# Patient Record
Sex: Female | Born: 1935 | Race: White | Hispanic: No | Marital: Married | Smoking: Never smoker
Health system: Southern US, Community
[De-identification: ages and names within clinical notes are randomized; demographics above are authoritative.]

## PROBLEM LIST (undated history)

## (undated) DIAGNOSIS — E039 Hypothyroidism, unspecified: Secondary | ICD-10-CM

## (undated) DIAGNOSIS — M797 Fibromyalgia: Secondary | ICD-10-CM

## (undated) DIAGNOSIS — Z923 Personal history of irradiation: Secondary | ICD-10-CM

## (undated) DIAGNOSIS — M1990 Unspecified osteoarthritis, unspecified site: Secondary | ICD-10-CM

## (undated) DIAGNOSIS — J302 Other seasonal allergic rhinitis: Secondary | ICD-10-CM

## (undated) HISTORY — PX: EYE SURGERY: SHX253

## (undated) HISTORY — PX: ABDOMINAL HYSTERECTOMY: SHX81

---

## 1998-07-03 ENCOUNTER — Ambulatory Visit (HOSPITAL_COMMUNITY): Admission: RE | Admit: 1998-07-03 | Discharge: 1998-07-03 | Payer: Self-pay | Admitting: Gynecology

## 1998-08-31 ENCOUNTER — Other Ambulatory Visit: Admission: RE | Admit: 1998-08-31 | Discharge: 1998-08-31 | Payer: Self-pay | Admitting: Gynecology

## 2011-10-27 DIAGNOSIS — M2010 Hallux valgus (acquired), unspecified foot: Secondary | ICD-10-CM | POA: Diagnosis not present

## 2011-10-27 DIAGNOSIS — M19079 Primary osteoarthritis, unspecified ankle and foot: Secondary | ICD-10-CM | POA: Diagnosis not present

## 2011-10-27 DIAGNOSIS — G5760 Lesion of plantar nerve, unspecified lower limb: Secondary | ICD-10-CM | POA: Diagnosis not present

## 2011-11-02 DIAGNOSIS — L905 Scar conditions and fibrosis of skin: Secondary | ICD-10-CM | POA: Diagnosis not present

## 2011-12-26 DIAGNOSIS — H26499 Other secondary cataract, unspecified eye: Secondary | ICD-10-CM | POA: Diagnosis not present

## 2012-02-01 DIAGNOSIS — Z85828 Personal history of other malignant neoplasm of skin: Secondary | ICD-10-CM | POA: Diagnosis not present

## 2012-03-09 ENCOUNTER — Other Ambulatory Visit (HOSPITAL_COMMUNITY): Payer: Self-pay | Admitting: Internal Medicine

## 2012-03-09 DIAGNOSIS — R29818 Other symptoms and signs involving the nervous system: Secondary | ICD-10-CM

## 2012-03-09 DIAGNOSIS — R42 Dizziness and giddiness: Secondary | ICD-10-CM | POA: Diagnosis not present

## 2012-03-12 ENCOUNTER — Ambulatory Visit (HOSPITAL_COMMUNITY): Admission: RE | Admit: 2012-03-12 | Payer: Self-pay | Source: Ambulatory Visit

## 2012-03-12 ENCOUNTER — Ambulatory Visit (HOSPITAL_COMMUNITY)
Admission: RE | Admit: 2012-03-12 | Discharge: 2012-03-12 | Disposition: A | Discharge status: Home / Self Care | Payer: Medicare Other | Source: Ambulatory Visit | Attending: Internal Medicine | Admitting: Internal Medicine

## 2012-03-12 DIAGNOSIS — G319 Degenerative disease of nervous system, unspecified: Secondary | ICD-10-CM | POA: Insufficient documentation

## 2012-03-12 DIAGNOSIS — R29818 Other symptoms and signs involving the nervous system: Secondary | ICD-10-CM

## 2012-03-12 DIAGNOSIS — R42 Dizziness and giddiness: Secondary | ICD-10-CM | POA: Insufficient documentation

## 2012-03-12 DIAGNOSIS — Z8589 Personal history of malignant neoplasm of other organs and systems: Secondary | ICD-10-CM | POA: Insufficient documentation

## 2012-03-12 DIAGNOSIS — Z859 Personal history of malignant neoplasm, unspecified: Secondary | ICD-10-CM | POA: Diagnosis not present

## 2012-03-12 DIAGNOSIS — I6509 Occlusion and stenosis of unspecified vertebral artery: Secondary | ICD-10-CM | POA: Insufficient documentation

## 2012-03-12 DIAGNOSIS — R262 Difficulty in walking, not elsewhere classified: Secondary | ICD-10-CM | POA: Insufficient documentation

## 2012-03-12 DIAGNOSIS — I651 Occlusion and stenosis of basilar artery: Secondary | ICD-10-CM | POA: Insufficient documentation

## 2012-04-11 DIAGNOSIS — Z131 Encounter for screening for diabetes mellitus: Secondary | ICD-10-CM | POA: Diagnosis not present

## 2012-04-11 DIAGNOSIS — E039 Hypothyroidism, unspecified: Secondary | ICD-10-CM | POA: Diagnosis not present

## 2012-04-11 DIAGNOSIS — G500 Trigeminal neuralgia: Secondary | ICD-10-CM | POA: Diagnosis not present

## 2012-04-11 DIAGNOSIS — K5900 Constipation, unspecified: Secondary | ICD-10-CM | POA: Diagnosis not present

## 2012-04-11 DIAGNOSIS — Z0000 Encounter for general adult medical examination without abnormal findings: Secondary | ICD-10-CM | POA: Diagnosis not present

## 2012-08-01 DIAGNOSIS — Z85828 Personal history of other malignant neoplasm of skin: Secondary | ICD-10-CM | POA: Diagnosis not present

## 2012-08-01 DIAGNOSIS — D485 Neoplasm of uncertain behavior of skin: Secondary | ICD-10-CM | POA: Diagnosis not present

## 2012-08-21 DIAGNOSIS — Z23 Encounter for immunization: Secondary | ICD-10-CM | POA: Diagnosis not present

## 2012-10-03 DIAGNOSIS — E039 Hypothyroidism, unspecified: Secondary | ICD-10-CM | POA: Diagnosis not present

## 2012-10-05 DIAGNOSIS — H26499 Other secondary cataract, unspecified eye: Secondary | ICD-10-CM | POA: Diagnosis not present

## 2012-11-26 DIAGNOSIS — H26499 Other secondary cataract, unspecified eye: Secondary | ICD-10-CM | POA: Diagnosis not present

## 2012-12-03 DIAGNOSIS — H26499 Other secondary cataract, unspecified eye: Secondary | ICD-10-CM | POA: Diagnosis not present

## 2012-12-04 DIAGNOSIS — N3090 Cystitis, unspecified without hematuria: Secondary | ICD-10-CM | POA: Diagnosis not present

## 2012-12-04 DIAGNOSIS — L723 Sebaceous cyst: Secondary | ICD-10-CM | POA: Diagnosis not present

## 2012-12-04 DIAGNOSIS — R319 Hematuria, unspecified: Secondary | ICD-10-CM | POA: Diagnosis not present

## 2013-02-13 DIAGNOSIS — M5430 Sciatica, unspecified side: Secondary | ICD-10-CM | POA: Diagnosis not present

## 2013-02-14 DIAGNOSIS — L219 Seborrheic dermatitis, unspecified: Secondary | ICD-10-CM | POA: Diagnosis not present

## 2013-02-14 DIAGNOSIS — Z85828 Personal history of other malignant neoplasm of skin: Secondary | ICD-10-CM | POA: Diagnosis not present

## 2013-02-25 DIAGNOSIS — M25559 Pain in unspecified hip: Secondary | ICD-10-CM | POA: Diagnosis not present

## 2013-02-25 DIAGNOSIS — M4120 Other idiopathic scoliosis, site unspecified: Secondary | ICD-10-CM | POA: Diagnosis not present

## 2013-02-25 DIAGNOSIS — M47817 Spondylosis without myelopathy or radiculopathy, lumbosacral region: Secondary | ICD-10-CM | POA: Diagnosis not present

## 2013-02-25 DIAGNOSIS — M25569 Pain in unspecified knee: Secondary | ICD-10-CM | POA: Diagnosis not present

## 2013-02-27 DIAGNOSIS — M25559 Pain in unspecified hip: Secondary | ICD-10-CM | POA: Diagnosis not present

## 2013-03-28 DIAGNOSIS — M25559 Pain in unspecified hip: Secondary | ICD-10-CM | POA: Diagnosis not present

## 2013-04-04 DIAGNOSIS — R262 Difficulty in walking, not elsewhere classified: Secondary | ICD-10-CM | POA: Diagnosis not present

## 2013-04-04 DIAGNOSIS — M25559 Pain in unspecified hip: Secondary | ICD-10-CM | POA: Diagnosis not present

## 2013-04-04 DIAGNOSIS — M6281 Muscle weakness (generalized): Secondary | ICD-10-CM | POA: Diagnosis not present

## 2013-04-08 DIAGNOSIS — R262 Difficulty in walking, not elsewhere classified: Secondary | ICD-10-CM | POA: Diagnosis not present

## 2013-04-08 DIAGNOSIS — M6281 Muscle weakness (generalized): Secondary | ICD-10-CM | POA: Diagnosis not present

## 2013-04-08 DIAGNOSIS — M25559 Pain in unspecified hip: Secondary | ICD-10-CM | POA: Diagnosis not present

## 2013-04-10 DIAGNOSIS — M6281 Muscle weakness (generalized): Secondary | ICD-10-CM | POA: Diagnosis not present

## 2013-04-10 DIAGNOSIS — R262 Difficulty in walking, not elsewhere classified: Secondary | ICD-10-CM | POA: Diagnosis not present

## 2013-04-10 DIAGNOSIS — M25559 Pain in unspecified hip: Secondary | ICD-10-CM | POA: Diagnosis not present

## 2013-04-12 DIAGNOSIS — M6281 Muscle weakness (generalized): Secondary | ICD-10-CM | POA: Diagnosis not present

## 2013-04-12 DIAGNOSIS — R262 Difficulty in walking, not elsewhere classified: Secondary | ICD-10-CM | POA: Diagnosis not present

## 2013-04-15 DIAGNOSIS — M47817 Spondylosis without myelopathy or radiculopathy, lumbosacral region: Secondary | ICD-10-CM | POA: Diagnosis not present

## 2013-04-15 DIAGNOSIS — M25569 Pain in unspecified knee: Secondary | ICD-10-CM | POA: Diagnosis not present

## 2013-04-15 DIAGNOSIS — M25559 Pain in unspecified hip: Secondary | ICD-10-CM | POA: Diagnosis not present

## 2013-04-17 DIAGNOSIS — M25559 Pain in unspecified hip: Secondary | ICD-10-CM | POA: Diagnosis not present

## 2013-04-17 DIAGNOSIS — R262 Difficulty in walking, not elsewhere classified: Secondary | ICD-10-CM | POA: Diagnosis not present

## 2013-04-17 DIAGNOSIS — M6281 Muscle weakness (generalized): Secondary | ICD-10-CM | POA: Diagnosis not present

## 2013-04-19 DIAGNOSIS — R262 Difficulty in walking, not elsewhere classified: Secondary | ICD-10-CM | POA: Diagnosis not present

## 2013-04-19 DIAGNOSIS — M6281 Muscle weakness (generalized): Secondary | ICD-10-CM | POA: Diagnosis not present

## 2013-04-24 DIAGNOSIS — G500 Trigeminal neuralgia: Secondary | ICD-10-CM | POA: Diagnosis not present

## 2013-04-24 DIAGNOSIS — M5430 Sciatica, unspecified side: Secondary | ICD-10-CM | POA: Diagnosis not present

## 2013-04-24 DIAGNOSIS — E039 Hypothyroidism, unspecified: Secondary | ICD-10-CM | POA: Diagnosis not present

## 2013-04-24 DIAGNOSIS — Z79899 Other long term (current) drug therapy: Secondary | ICD-10-CM | POA: Diagnosis not present

## 2013-06-05 DIAGNOSIS — H11449 Conjunctival cysts, unspecified eye: Secondary | ICD-10-CM | POA: Diagnosis not present

## 2013-07-18 DIAGNOSIS — G500 Trigeminal neuralgia: Secondary | ICD-10-CM | POA: Diagnosis not present

## 2013-07-18 DIAGNOSIS — Z0000 Encounter for general adult medical examination without abnormal findings: Secondary | ICD-10-CM | POA: Diagnosis not present

## 2013-07-18 DIAGNOSIS — E039 Hypothyroidism, unspecified: Secondary | ICD-10-CM | POA: Diagnosis not present

## 2013-07-18 DIAGNOSIS — K5900 Constipation, unspecified: Secondary | ICD-10-CM | POA: Diagnosis not present

## 2013-07-18 DIAGNOSIS — Z1331 Encounter for screening for depression: Secondary | ICD-10-CM | POA: Diagnosis not present

## 2013-08-09 DIAGNOSIS — Z23 Encounter for immunization: Secondary | ICD-10-CM | POA: Diagnosis not present

## 2014-01-15 DIAGNOSIS — M76899 Other specified enthesopathies of unspecified lower limb, excluding foot: Secondary | ICD-10-CM | POA: Diagnosis not present

## 2014-01-15 DIAGNOSIS — E7800 Pure hypercholesterolemia, unspecified: Secondary | ICD-10-CM | POA: Diagnosis not present

## 2014-01-15 DIAGNOSIS — G500 Trigeminal neuralgia: Secondary | ICD-10-CM | POA: Diagnosis not present

## 2014-01-15 DIAGNOSIS — E039 Hypothyroidism, unspecified: Secondary | ICD-10-CM | POA: Diagnosis not present

## 2014-03-10 DIAGNOSIS — M25569 Pain in unspecified knee: Secondary | ICD-10-CM | POA: Diagnosis not present

## 2014-03-13 DIAGNOSIS — M545 Low back pain, unspecified: Secondary | ICD-10-CM | POA: Diagnosis not present

## 2014-03-13 DIAGNOSIS — M25569 Pain in unspecified knee: Secondary | ICD-10-CM | POA: Diagnosis not present

## 2014-03-13 DIAGNOSIS — M25559 Pain in unspecified hip: Secondary | ICD-10-CM | POA: Diagnosis not present

## 2014-04-08 DIAGNOSIS — G500 Trigeminal neuralgia: Secondary | ICD-10-CM | POA: Diagnosis not present

## 2014-04-08 DIAGNOSIS — Z79899 Other long term (current) drug therapy: Secondary | ICD-10-CM | POA: Diagnosis not present

## 2014-04-11 DIAGNOSIS — D235 Other benign neoplasm of skin of trunk: Secondary | ICD-10-CM | POA: Diagnosis not present

## 2014-04-11 DIAGNOSIS — L821 Other seborrheic keratosis: Secondary | ICD-10-CM | POA: Diagnosis not present

## 2014-04-11 DIAGNOSIS — L570 Actinic keratosis: Secondary | ICD-10-CM | POA: Diagnosis not present

## 2014-04-11 DIAGNOSIS — D1801 Hemangioma of skin and subcutaneous tissue: Secondary | ICD-10-CM | POA: Diagnosis not present

## 2014-04-11 DIAGNOSIS — L819 Disorder of pigmentation, unspecified: Secondary | ICD-10-CM | POA: Diagnosis not present

## 2014-04-11 DIAGNOSIS — D485 Neoplasm of uncertain behavior of skin: Secondary | ICD-10-CM | POA: Diagnosis not present

## 2014-05-12 DIAGNOSIS — M159 Polyosteoarthritis, unspecified: Secondary | ICD-10-CM | POA: Diagnosis not present

## 2014-07-21 DIAGNOSIS — Z0000 Encounter for general adult medical examination without abnormal findings: Secondary | ICD-10-CM | POA: Diagnosis not present

## 2014-07-21 DIAGNOSIS — G500 Trigeminal neuralgia: Secondary | ICD-10-CM | POA: Diagnosis not present

## 2014-07-21 DIAGNOSIS — Z23 Encounter for immunization: Secondary | ICD-10-CM | POA: Diagnosis not present

## 2014-07-21 DIAGNOSIS — G479 Sleep disorder, unspecified: Secondary | ICD-10-CM | POA: Diagnosis not present

## 2014-07-21 DIAGNOSIS — Z79899 Other long term (current) drug therapy: Secondary | ICD-10-CM | POA: Diagnosis not present

## 2014-07-21 DIAGNOSIS — E039 Hypothyroidism, unspecified: Secondary | ICD-10-CM | POA: Diagnosis not present

## 2014-07-21 DIAGNOSIS — Z1331 Encounter for screening for depression: Secondary | ICD-10-CM | POA: Diagnosis not present

## 2014-07-29 DIAGNOSIS — M1712 Unilateral primary osteoarthritis, left knee: Secondary | ICD-10-CM | POA: Diagnosis not present

## 2014-12-25 DIAGNOSIS — L570 Actinic keratosis: Secondary | ICD-10-CM | POA: Diagnosis not present

## 2015-01-15 DIAGNOSIS — E039 Hypothyroidism, unspecified: Secondary | ICD-10-CM | POA: Diagnosis not present

## 2015-01-15 DIAGNOSIS — G479 Sleep disorder, unspecified: Secondary | ICD-10-CM | POA: Diagnosis not present

## 2015-02-24 DIAGNOSIS — L905 Scar conditions and fibrosis of skin: Secondary | ICD-10-CM | POA: Diagnosis not present

## 2015-02-24 DIAGNOSIS — L820 Inflamed seborrheic keratosis: Secondary | ICD-10-CM | POA: Diagnosis not present

## 2015-07-23 DIAGNOSIS — Z1389 Encounter for screening for other disorder: Secondary | ICD-10-CM | POA: Diagnosis not present

## 2015-07-23 DIAGNOSIS — E039 Hypothyroidism, unspecified: Secondary | ICD-10-CM | POA: Diagnosis not present

## 2015-07-23 DIAGNOSIS — Z23 Encounter for immunization: Secondary | ICD-10-CM | POA: Diagnosis not present

## 2015-07-23 DIAGNOSIS — Z79899 Other long term (current) drug therapy: Secondary | ICD-10-CM | POA: Diagnosis not present

## 2015-07-23 DIAGNOSIS — I10 Essential (primary) hypertension: Secondary | ICD-10-CM | POA: Diagnosis not present

## 2015-07-23 DIAGNOSIS — Z0000 Encounter for general adult medical examination without abnormal findings: Secondary | ICD-10-CM | POA: Diagnosis not present

## 2015-07-23 DIAGNOSIS — E780 Pure hypercholesterolemia: Secondary | ICD-10-CM | POA: Diagnosis not present

## 2015-08-19 DIAGNOSIS — I10 Essential (primary) hypertension: Secondary | ICD-10-CM | POA: Diagnosis not present

## 2015-09-22 DIAGNOSIS — L570 Actinic keratosis: Secondary | ICD-10-CM | POA: Diagnosis not present

## 2015-09-22 DIAGNOSIS — L821 Other seborrheic keratosis: Secondary | ICD-10-CM | POA: Diagnosis not present

## 2015-09-22 DIAGNOSIS — L812 Freckles: Secondary | ICD-10-CM | POA: Diagnosis not present

## 2015-09-22 DIAGNOSIS — D225 Melanocytic nevi of trunk: Secondary | ICD-10-CM | POA: Diagnosis not present

## 2015-11-12 DIAGNOSIS — M1712 Unilateral primary osteoarthritis, left knee: Secondary | ICD-10-CM | POA: Diagnosis not present

## 2015-11-12 DIAGNOSIS — M545 Low back pain: Secondary | ICD-10-CM | POA: Diagnosis not present

## 2016-04-04 DIAGNOSIS — H11441 Conjunctival cysts, right eye: Secondary | ICD-10-CM | POA: Diagnosis not present

## 2016-04-07 DIAGNOSIS — D1801 Hemangioma of skin and subcutaneous tissue: Secondary | ICD-10-CM | POA: Diagnosis not present

## 2016-04-07 DIAGNOSIS — L814 Other melanin hyperpigmentation: Secondary | ICD-10-CM | POA: Diagnosis not present

## 2016-04-07 DIAGNOSIS — L570 Actinic keratosis: Secondary | ICD-10-CM | POA: Diagnosis not present

## 2016-04-07 DIAGNOSIS — L853 Xerosis cutis: Secondary | ICD-10-CM | POA: Diagnosis not present

## 2016-05-02 DIAGNOSIS — K5901 Slow transit constipation: Secondary | ICD-10-CM | POA: Diagnosis not present

## 2016-05-02 DIAGNOSIS — L608 Other nail disorders: Secondary | ICD-10-CM | POA: Diagnosis not present

## 2016-05-02 DIAGNOSIS — R202 Paresthesia of skin: Secondary | ICD-10-CM | POA: Diagnosis not present

## 2016-05-09 DIAGNOSIS — M1812 Unilateral primary osteoarthritis of first carpometacarpal joint, left hand: Secondary | ICD-10-CM | POA: Insufficient documentation

## 2016-05-09 DIAGNOSIS — M79642 Pain in left hand: Secondary | ICD-10-CM | POA: Diagnosis not present

## 2016-05-09 DIAGNOSIS — M65352 Trigger finger, left little finger: Secondary | ICD-10-CM | POA: Diagnosis not present

## 2016-05-09 DIAGNOSIS — M6740 Ganglion, unspecified site: Secondary | ICD-10-CM | POA: Insufficient documentation

## 2016-05-09 DIAGNOSIS — M19042 Primary osteoarthritis, left hand: Secondary | ICD-10-CM | POA: Insufficient documentation

## 2016-05-09 DIAGNOSIS — R52 Pain, unspecified: Secondary | ICD-10-CM | POA: Insufficient documentation

## 2016-05-09 DIAGNOSIS — M67442 Ganglion, left hand: Secondary | ICD-10-CM | POA: Diagnosis not present

## 2016-05-12 ENCOUNTER — Other Ambulatory Visit: Payer: Self-pay | Admitting: Orthopedic Surgery

## 2016-05-27 ENCOUNTER — Encounter (HOSPITAL_BASED_OUTPATIENT_CLINIC_OR_DEPARTMENT_OTHER): Payer: Self-pay | Admitting: *Deleted

## 2016-06-02 ENCOUNTER — Ambulatory Visit (HOSPITAL_BASED_OUTPATIENT_CLINIC_OR_DEPARTMENT_OTHER)
Admission: RE | Admit: 2016-06-02 | Discharge: 2016-06-02 | Disposition: A | Discharge status: Home / Self Care | Payer: Medicare Other | Source: Ambulatory Visit | Attending: Orthopedic Surgery | Admitting: Orthopedic Surgery

## 2016-06-02 ENCOUNTER — Encounter (HOSPITAL_BASED_OUTPATIENT_CLINIC_OR_DEPARTMENT_OTHER): Payer: Self-pay | Admitting: Anesthesiology

## 2016-06-02 ENCOUNTER — Encounter (HOSPITAL_BASED_OUTPATIENT_CLINIC_OR_DEPARTMENT_OTHER): Payer: Self-pay

## 2016-06-02 ENCOUNTER — Encounter (HOSPITAL_BASED_OUTPATIENT_CLINIC_OR_DEPARTMENT_OTHER)
Admission: RE | Disposition: A | Discharge status: Home / Self Care | Payer: Self-pay | Source: Ambulatory Visit | Attending: Orthopedic Surgery

## 2016-06-02 ENCOUNTER — Ambulatory Visit (HOSPITAL_BASED_OUTPATIENT_CLINIC_OR_DEPARTMENT_OTHER): Payer: Medicare Other | Admitting: Anesthesiology

## 2016-06-02 ENCOUNTER — Ambulatory Visit (HOSPITAL_BASED_OUTPATIENT_CLINIC_OR_DEPARTMENT_OTHER): Admit: 2016-06-02 | Payer: Self-pay | Admitting: Orthopedic Surgery

## 2016-06-02 DIAGNOSIS — M151 Heberden's nodes (with arthropathy): Secondary | ICD-10-CM | POA: Diagnosis not present

## 2016-06-02 DIAGNOSIS — M67442 Ganglion, left hand: Secondary | ICD-10-CM | POA: Insufficient documentation

## 2016-06-02 DIAGNOSIS — M19072 Primary osteoarthritis, left ankle and foot: Secondary | ICD-10-CM | POA: Diagnosis not present

## 2016-06-02 DIAGNOSIS — E039 Hypothyroidism, unspecified: Secondary | ICD-10-CM | POA: Insufficient documentation

## 2016-06-02 DIAGNOSIS — M1810 Unilateral primary osteoarthritis of first carpometacarpal joint, unspecified hand: Secondary | ICD-10-CM | POA: Insufficient documentation

## 2016-06-02 DIAGNOSIS — M797 Fibromyalgia: Secondary | ICD-10-CM | POA: Insufficient documentation

## 2016-06-02 HISTORY — DX: Hypothyroidism, unspecified: E03.9

## 2016-06-02 HISTORY — DX: Other seasonal allergic rhinitis: J30.2

## 2016-06-02 HISTORY — PX: MASS EXCISION: SHX2000

## 2016-06-02 HISTORY — DX: Fibromyalgia: M79.7

## 2016-06-02 SURGERY — EXCISION MASS
Anesthesia: Monitor Anesthesia Care | Laterality: Left

## 2016-06-02 SURGERY — EXCISION MASS
Anesthesia: Monitor Anesthesia Care | Site: Finger | Laterality: Left

## 2016-06-02 MED ORDER — MIDAZOLAM HCL 2 MG/2ML IJ SOLN: 1.0000 mg | INTRAMUSCULAR | Status: DC | PRN

## 2016-06-02 MED ORDER — LACTATED RINGERS IV SOLN
INTRAVENOUS | Status: DC
  Administered 2016-06-02: 11:00:00 via INTRAVENOUS

## 2016-06-02 MED ORDER — LIDOCAINE HCL (PF) 0.5 % IJ SOLN
INTRAMUSCULAR | Status: DC | PRN
  Administered 2016-06-02: 30 mL via INTRAVENOUS

## 2016-06-02 MED ORDER — FENTANYL CITRATE (PF) 100 MCG/2ML IJ SOLN
INTRAMUSCULAR | Status: AC
  Filled 2016-06-02: qty 2

## 2016-06-02 MED ORDER — FENTANYL CITRATE (PF) 100 MCG/2ML IJ SOLN
INTRAMUSCULAR | Status: DC | PRN
  Administered 2016-06-02: 100 ug via INTRAVENOUS

## 2016-06-02 MED ORDER — GLYCOPYRROLATE 0.2 MG/ML IJ SOLN: 0.2000 mg | Freq: Once | INTRAMUSCULAR | Status: DC | PRN

## 2016-06-02 MED ORDER — CEFAZOLIN SODIUM-DEXTROSE 2-4 GM/100ML-% IV SOLN
INTRAVENOUS | Status: AC
  Filled 2016-06-02: qty 100

## 2016-06-02 MED ORDER — HYDROCODONE-ACETAMINOPHEN 7.5-325 MG PO TABS: 1.0000 | ORAL_TABLET | Freq: Once | ORAL | Status: DC | PRN

## 2016-06-02 MED ORDER — PROMETHAZINE HCL 25 MG/ML IJ SOLN: 6.2500 mg | INTRAMUSCULAR | Status: DC | PRN

## 2016-06-02 MED ORDER — DEXAMETHASONE SODIUM PHOSPHATE 10 MG/ML IJ SOLN
INTRAMUSCULAR | Status: AC
  Filled 2016-06-02: qty 1

## 2016-06-02 MED ORDER — FENTANYL CITRATE (PF) 100 MCG/2ML IJ SOLN: 50.0000 ug | INTRAMUSCULAR | Status: DC | PRN

## 2016-06-02 MED ORDER — BUPIVACAINE HCL (PF) 0.25 % IJ SOLN
INTRAMUSCULAR | Status: DC | PRN
  Administered 2016-06-02: 6 mL

## 2016-06-02 MED ORDER — CHLORHEXIDINE GLUCONATE 4 % EX LIQD: 60.0000 mL | Freq: Once | CUTANEOUS | Status: DC

## 2016-06-02 MED ORDER — FENTANYL CITRATE (PF) 100 MCG/2ML IJ SOLN: 25.0000 ug | INTRAMUSCULAR | Status: DC | PRN

## 2016-06-02 MED ORDER — SCOPOLAMINE 1 MG/3DAYS TD PT72: 1.0000 | MEDICATED_PATCH | Freq: Once | TRANSDERMAL | Status: DC | PRN

## 2016-06-02 MED ORDER — ONDANSETRON HCL 4 MG/2ML IJ SOLN
INTRAMUSCULAR | Status: AC
  Filled 2016-06-02: qty 2

## 2016-06-02 MED ORDER — ONDANSETRON HCL 4 MG/2ML IJ SOLN
INTRAMUSCULAR | Status: DC | PRN
  Administered 2016-06-02: 4 mg via INTRAVENOUS

## 2016-06-02 MED ORDER — LIDOCAINE 2% (20 MG/ML) 5 ML SYRINGE
INTRAMUSCULAR | Status: AC
  Filled 2016-06-02: qty 5

## 2016-06-02 MED ORDER — PROPOFOL 500 MG/50ML IV EMUL
INTRAVENOUS | Status: DC | PRN
  Administered 2016-06-02: 25 ug/kg/min via INTRAVENOUS

## 2016-06-02 MED ORDER — PROPOFOL 10 MG/ML IV BOLUS
INTRAVENOUS | Status: AC
  Filled 2016-06-02: qty 20

## 2016-06-02 MED ORDER — CEFAZOLIN SODIUM-DEXTROSE 2-4 GM/100ML-% IV SOLN
2.0000 g | INTRAVENOUS | Status: AC
  Administered 2016-06-02: 2 g via INTRAVENOUS

## 2016-06-02 MED ORDER — TRAMADOL HCL 50 MG PO TABS: 50.0000 mg | ORAL_TABLET | Freq: Four times a day (QID) | ORAL | 0 refills | Status: DC | PRN

## 2016-06-02 SURGICAL SUPPLY — 44 items
BANDAGE COBAN STERILE 2 (GAUZE/BANDAGES/DRESSINGS) IMPLANT
BLADE SURG 15 STRL LF DISP TIS (BLADE) ×1 IMPLANT
BLADE SURG 15 STRL SS (BLADE) ×1
BNDG COHESIVE 1X5 TAN STRL LF (GAUZE/BANDAGES/DRESSINGS) ×2 IMPLANT
BNDG COHESIVE 3X5 TAN STRL LF (GAUZE/BANDAGES/DRESSINGS) IMPLANT
BNDG ESMARK 4X9 LF (GAUZE/BANDAGES/DRESSINGS) IMPLANT
BNDG GAUZE ELAST 4 BULKY (GAUZE/BANDAGES/DRESSINGS) IMPLANT
CHLORAPREP W/TINT 26ML (MISCELLANEOUS) ×2 IMPLANT
CORDS BIPOLAR (ELECTRODE) ×2 IMPLANT
COVER BACK TABLE 60X90IN (DRAPES) ×2 IMPLANT
COVER MAYO STAND STRL (DRAPES) ×2 IMPLANT
CUFF TOURNIQUET SINGLE 18IN (TOURNIQUET CUFF) ×2 IMPLANT
DECANTER SPIKE VIAL GLASS SM (MISCELLANEOUS) IMPLANT
DRAIN PENROSE 1/2X12 LTX STRL (WOUND CARE) IMPLANT
DRAPE EXTREMITY T 121X128X90 (DRAPE) ×2 IMPLANT
DRAPE SURG 17X23 STRL (DRAPES) ×2 IMPLANT
GAUZE SPONGE 4X4 12PLY STRL (GAUZE/BANDAGES/DRESSINGS) ×2 IMPLANT
GAUZE XEROFORM 1X8 LF (GAUZE/BANDAGES/DRESSINGS) ×2 IMPLANT
GLOVE BIO SURGEON STRL SZ 6.5 (GLOVE) ×2 IMPLANT
GLOVE BIOGEL PI IND STRL 7.0 (GLOVE) ×2 IMPLANT
GLOVE BIOGEL PI IND STRL 8.5 (GLOVE) ×1 IMPLANT
GLOVE BIOGEL PI INDICATOR 7.0 (GLOVE) ×2
GLOVE BIOGEL PI INDICATOR 8.5 (GLOVE) ×1
GLOVE SURG ORTHO 8.0 STRL STRW (GLOVE) ×2 IMPLANT
GOWN STRL REUS W/ TWL LRG LVL3 (GOWN DISPOSABLE) ×1 IMPLANT
GOWN STRL REUS W/TWL LRG LVL3 (GOWN DISPOSABLE) ×1
GOWN STRL REUS W/TWL XL LVL3 (GOWN DISPOSABLE) ×2 IMPLANT
NDL SAFETY ECLIPSE 18X1.5 (NEEDLE) IMPLANT
NEEDLE HYPO 18GX1.5 SHARP (NEEDLE)
NEEDLE PRECISIONGLIDE 27X1.5 (NEEDLE) ×2 IMPLANT
NS IRRIG 1000ML POUR BTL (IV SOLUTION) ×2 IMPLANT
PACK BASIN DAY SURGERY FS (CUSTOM PROCEDURE TRAY) ×2 IMPLANT
PAD CAST 3X4 CTTN HI CHSV (CAST SUPPLIES) IMPLANT
PADDING CAST COTTON 3X4 STRL (CAST SUPPLIES)
SPLINT FINGER 3.25 911903 (SOFTGOODS) ×2 IMPLANT
SPLINT PLASTER CAST XFAST 3X15 (CAST SUPPLIES) IMPLANT
SPLINT PLASTER XTRA FASTSET 3X (CAST SUPPLIES)
STOCKINETTE 4X48 STRL (DRAPES) ×2 IMPLANT
SUT ETHILON 4 0 PS 2 18 (SUTURE) ×2 IMPLANT
SUT VIC AB 4-0 P2 18 (SUTURE) IMPLANT
SYR BULB 3OZ (MISCELLANEOUS) ×2 IMPLANT
SYR CONTROL 10ML LL (SYRINGE) ×2 IMPLANT
TOWEL OR 17X24 6PK STRL BLUE (TOWEL DISPOSABLE) ×2 IMPLANT
UNDERPAD 30X30 (UNDERPADS AND DIAPERS) ×2 IMPLANT

## 2016-06-02 NOTE — Anesthesia Procedure Notes (Signed)
Procedure Name: MAC Date/Time: 06/02/2016 11:07 AM Performed by: Marrianne Mood Pre-anesthesia Checklist: Patient identified, Emergency Drugs available, Suction available, Patient being monitored and Timeout performed Patient Re-evaluated:Patient Re-evaluated prior to inductionOxygen Delivery Method: Simple face mask

## 2016-06-02 NOTE — Anesthesia Postprocedure Evaluation (Signed)
Anesthesia Post Note  Patient: Jamie Mahoney  Procedure(s) Performed: Procedure(s) (LRB): EXCISION MUCOID CYST DEBRIDEMENT DISTAL INTERPHALANGEAL LEFT INDEX FINGER (Left)  Patient location during evaluation: PACU Anesthesia Type: MAC and Bier Block Level of consciousness: awake and alert Pain management: pain level controlled Vital Signs Assessment: post-procedure vital signs reviewed and stable Respiratory status: spontaneous breathing, nonlabored ventilation, respiratory function stable and patient connected to nasal cannula oxygen Cardiovascular status: stable and blood pressure returned to baseline Anesthetic complications: no    Last Vitals:  Vitals:   06/02/16 1215 06/02/16 1235  BP: 129/79 126/76  Pulse: 75 78  Resp: 20 16  Temp:  36.7 C    Last Pain:  Vitals:   06/02/16 1235  TempSrc:   PainSc: 0-No pain                 Tiajuana Amass

## 2016-06-02 NOTE — Transfer of Care (Signed)
Immediate Anesthesia Transfer of Care Note  Patient: Jamie Mahoney  Procedure(s) Performed: Procedure(s): EXCISION MUCOID CYST DEBRIDEMENT DISTAL INTERPHALANGEAL LEFT INDEX FINGER (Left)  Patient Location: PACU  Anesthesia Type:MAC  Level of Consciousness: awake, alert  and oriented  Airway & Oxygen Therapy: Patient Spontanous Breathing and Patient connected to face mask oxygen  Post-op Assessment: Report given to RN and Post -op Vital signs reviewed and stable  Post vital signs: Reviewed and stable  Last Vitals:  Vitals:   06/02/16 1025  BP: (!) 158/80  Pulse: 75  Resp: 18  Temp: 36.7 C    Last Pain:  Vitals:   06/02/16 1025  TempSrc: Oral         Complications: No apparent anesthesia complications

## 2016-06-02 NOTE — Discharge Instructions (Addendum)

## 2016-06-02 NOTE — H&P (Signed)
Jamie Mahoney is an 82 y.o. female.   Chief Complaint: mass left index finger HPI:Jamie Mahoney is an 82 year old right hand dominant female referred by Dr. Felipa Mahoney for a consultation regarding a mass on her left index finger at the DIP joint with deformity of the nail. This has been going on for at least 6 months. She recalls no history of injury to it. She is not complaining of any pain. She also h it the ulnar aspect of her left hand on a table 2 months ago. She thought it was getting better and it has not. She complains of pain over the palmar aspect and MCP joint into the muscles with an aching type pain with a VAS score of 5/10. Nothing seems to make this better or worse. She has not had any treatment or tried anything for this. She has no history of diabetes. She has a history of thyroid problems and arthritis. She has no history of gout. These are negative for her family. She has seen an orthopaedic surgeon in the past and was placed on a Medrol Dosepak which she was unable to tolerate. She has had injections in the past without problems.  PAST MEDICAL HISTORY: She has a history of arthritis, thyroid problems. She has had problems with cataracts removed. She does not smoke.  Past Medical History:  Diagnosis Date  . Arthritis  . Thyroid disease   Past Surgical History:  Procedure Laterality Date  . EYE SURGERY  . HYSTERECTOMY 1963       Past Medical History:  Diagnosis Date  . Fibromyalgia   . Hypothyroidism   . Seasonal allergies     Past Surgical History:  Procedure Laterality Date  . ABDOMINAL HYSTERECTOMY    . EYE SURGERY     cataract    History reviewed. No pertinent family history. Social History:  reports that she has never smoked. She has never used smokeless tobacco. She reports that she drinks alcohol. She reports that she does not use drugs.  Allergies:  Allergies  Allergen Reactions  . Ivp Dye [Iodinated Diagnostic Agents] Hives  . Prednisone Anxiety     Medications Prior to Admission  Medication Sig Dispense Refill  . fluticasone (FLONASE) 50 MCG/ACT nasal spray Place into both nostrils daily.    Marland Kitchen gabapentin (NEURONTIN) 300 MG capsule Take 300 mg by mouth 3 (three) times daily.    Marland Kitchen levothyroxine (SYNTHROID, LEVOTHROID) 50 MCG tablet Take 50 mcg by mouth daily before breakfast.      No results found for this or any previous visit (from the past 48 hour(s)).  No results found.   Pertinent items are noted in HPI.  Height 5\' 2"  (1.575 m), weight 72.6 kg (160 lb).  General appearance: alert, cooperative and appears stated age Head: Normocephalic, without obvious abnormality Neck: no JVD Resp: clear to auscultation bilaterally Cardio: regular rate and rhythm, S1, S2 normal, no murmur, click, rub or gallop GI: soft, non-tender; bowel sounds normal; no masses,  no organomegaly Extremities: mass left index finger Pulses: 2+ and symmetric Skin: Skin color, texture, turgor normal. No rashes or lesions Neurologic: Grossly normal Incision/Wound: na  Assessment/Plan Assessment:  Trigger little finger of left hand  Mucoid cyst, joint  Primary osteoarthritis of first carpometacarpal joint of left hand  Osteoarthritis of finger, left    Plan: After thorough prep and informed consent the left small finger is injected with Celestone and Xylocaine. She is advised to use ice for swelling, Tylenol or ibuprofen  for pain. We have discussed the possibility of excision of the mucoid cyst with debridement of the DIP joint of the left index finger. Pre, peri and post op course are discussed along with risks and complications. Is aware there is no guarantee with surgery, possibility of infection, injury to arteries, nerves, tendons, incomplete relief of symptoms and dystrophy, and possibility of stiffness. She would like to proceed with this. Questions are encouraged and answered to his satisfaction. She is scheduled for excision of mucoid cyst,  debridement of the DIP joint index finger left hand as an outpatient under regional anesthesia.      Jamie Mahoney R 06/02/2016, 10:11 AM

## 2016-06-02 NOTE — Brief Op Note (Signed)
06/02/2016  11:43 AM  PATIENT:  Daron Offer  82 y.o. female  PRE-OPERATIVE DIAGNOSIS:  Left Index Finger Degenerative Joint Disease Distal mucoid cyst Interphalangeal  POST-OPERATIVE DIAGNOSIS:  Joint Disease Distal Interphalangeal left index mucoid cyst  PROCEDURE:  Procedure(s): EXCISION MUCOID CYST DEBRIDEMENT DISTAL INTERPHALANGEAL LEFT INDEX FINGER (Left)  SURGEON:  Surgeon(s) and Role:    * Daryll Brod, MD - Primary  PHYSICIAN ASSISTANT:   ASSISTANTS: none   ANESTHESIA:   local and regional  EBL:  Total I/O In: 800 [I.V.:800] Out: 5 [Blood:5]  BLOOD ADMINISTERED:none  DRAINS: none   LOCAL MEDICATIONS USED:  BUPIVICAINE   SPECIMEN:  Excision  DISPOSITION OF SPECIMEN:  N/A  COUNTS:  YES  TOURNIQUET:   Total Tourniquet Time Documented: Forearm (Left) - 24 minutes Total: Forearm (Left) - 24 minutes   DICTATION: .Other Dictation: Dictation Number [id number]  PLAN OF CARE: Discharge to home after PACU  PATIENT DISPOSITION:  PACU - hemodynamically stable.

## 2016-06-02 NOTE — Op Note (Signed)
Jamie Mahoney, Jamie Mahoney NO.:  [id number]  MEDICAL RECORD NO.:  [id number]  LOCATION:      [facility]                           FACILITY:  PHYSICIAN:  Daryll Brod, M.D.            DATE OF BIRTH:  DATE OF PROCEDURE: DATE OF DISCHARGE:                              OPERATIVE REPORT   PREOPERATIVE DIAGNOSES:  Mucoid tumor, distal interphalangeal joint arthritis, left index finger.  POSTOPERATIVE DIAGNOSES:  Mucoid tumor, distal interphalangeal joint arthritis, left index finger.  OPERATION:  Excision of mucoid cyst with debridement of distal interphalangeal joint, left index finger.  SURGEON:  Daryll Brod, M.D.  ANESTHESIA:  Forearm-based IV regional with metacarpal block.  ANESTHESIOLOGIST:  Kerri Perches, M.D.  PLACE OF SURGERY:  [facility].  HISTORY:  The patient is an 82 year old female with a history of a large mucoid cyst on the distal interphalangeal joint of her left index finger with grooving of the nail plate distally.  She was admitted for surgical excision with debridement of the joint.  Pre, peri, and postoperative course have been discussed along with risks and complications.  She is aware that there is no guarantee with the surgery; the possibility of infection; recurrence of injury to the arteries, nerves, tendons; incomplete relief of symptoms; and dystrophy.  DESCRIPTION OF PROCEDURE:  The patient was seen in the preoperative area.  The extremity was marked by both the patient and surgeon and antibiotic given.  She was brought to the operating room where a forearm- based IV regional anesthetic was carried out without difficulty under the direction of Dr. Ola Spurr.  She was prepped using ChloraPrep in supine position with the left arm free.  A metacarpal block was given with 0.25% bupivacaine without epinephrine, approximately 8 mL was used after a time-out was taken, confirming the patient and procedure.   A curvilinear incision was made over the distal interphalangeal joint down along the ulnar margin of the index finger middle phalanx, carried down through subcutaneous tissue.  Bleeders were electrocauterized bipolar as necessary.  The cyst was immediately encountered after elevating the skin distally.  This was removed with a hemostatic rongeur and House curette.  The specimen was sent to Pathology.  The joint was then opened after making an incision on the ulnar border of the extensor tendon.  A large amount of fluid was immediately encountered.  There was damage to the central aspect of the central slip from the cyst.  The dorsal synovectomy was then performed along with removal of osteophytes from the middle phalanx.  The specimen was sent to Pathology.  The wound was copiously irrigated with saline.  The skin was then closed with interrupted 4-0 nylon sutures.  A sterile compressive dressing and splint to the distal interphalangeal joint were then applied.  The patient tolerated the procedure well.  The tourniquet was removed and she was taken to the recovery room for observation in satisfactory condition.  She will be discharged home to return to the [facility] in 1 week on [facility].          ______________________________ Daryll Brod, M.D.  GK/MEDQ  D:  06/02/2016  T:  06/02/2016  Job:  [id number]

## 2016-06-02 NOTE — Anesthesia Preprocedure Evaluation (Addendum)
Anesthesia Evaluation  Patient identified by MRN, date of birth, ID band Patient awake    Reviewed: Allergy & Precautions, NPO status , Patient's Chart, lab work & pertinent test results  Airway Mallampati: II  TM Distance: >3 FB Neck ROM: Full    Dental   Pulmonary neg pulmonary ROS,    breath sounds clear to auscultation       Cardiovascular negative cardio ROS   Rhythm:Regular Rate:Normal     Neuro/Psych negative neurological ROS     GI/Hepatic negative GI ROS, Neg liver ROS,   Endo/Other  Hypothyroidism   Renal/GU negative Renal ROS     Musculoskeletal  (+) Fibromyalgia -  Abdominal   Peds  Hematology negative hematology ROS (+)   Anesthesia Other Findings   Reproductive/Obstetrics                            Anesthesia Physical Anesthesia Plan  ASA: II  Anesthesia Plan: MAC and Bier Block   Post-op Pain Management:    Induction: Intravenous  Airway Management Planned: Simple Face Mask and Natural Airway  Additional Equipment:   Intra-op Plan:   Post-operative Plan:   Informed Consent: I have reviewed the patients History and Physical, chart, labs and discussed the procedure including the risks, benefits and alternatives for the proposed anesthesia with the patient or authorized representative who has indicated his/her understanding and acceptance.     Plan Discussed with: CRNA  Anesthesia Plan Comments:        Anesthesia Quick Evaluation

## 2016-06-02 NOTE — Op Note (Signed)
Dictation Number [id number]

## 2016-06-03 ENCOUNTER — Encounter (HOSPITAL_BASED_OUTPATIENT_CLINIC_OR_DEPARTMENT_OTHER): Payer: Self-pay | Admitting: Orthopedic Surgery

## 2016-07-27 DIAGNOSIS — E7800 Pure hypercholesterolemia, unspecified: Secondary | ICD-10-CM | POA: Diagnosis not present

## 2016-07-27 DIAGNOSIS — Z79899 Other long term (current) drug therapy: Secondary | ICD-10-CM | POA: Diagnosis not present

## 2016-07-27 DIAGNOSIS — Z1389 Encounter for screening for other disorder: Secondary | ICD-10-CM | POA: Diagnosis not present

## 2016-07-27 DIAGNOSIS — E039 Hypothyroidism, unspecified: Secondary | ICD-10-CM | POA: Diagnosis not present

## 2016-07-27 DIAGNOSIS — Z0000 Encounter for general adult medical examination without abnormal findings: Secondary | ICD-10-CM | POA: Diagnosis not present

## 2016-07-27 DIAGNOSIS — R030 Elevated blood-pressure reading, without diagnosis of hypertension: Secondary | ICD-10-CM | POA: Diagnosis not present

## 2016-07-27 DIAGNOSIS — Z23 Encounter for immunization: Secondary | ICD-10-CM | POA: Diagnosis not present

## 2016-09-07 ENCOUNTER — Ambulatory Visit
Admission: RE | Admit: 2016-09-07 | Discharge: 2016-09-07 | Disposition: A | Discharge status: Home / Self Care | Payer: Medicare Other | Source: Ambulatory Visit | Attending: Geriatric Medicine | Admitting: Geriatric Medicine

## 2016-09-07 ENCOUNTER — Other Ambulatory Visit: Payer: Self-pay | Admitting: Geriatric Medicine

## 2016-09-07 DIAGNOSIS — M19011 Primary osteoarthritis, right shoulder: Secondary | ICD-10-CM | POA: Diagnosis not present

## 2016-09-07 DIAGNOSIS — I10 Essential (primary) hypertension: Secondary | ICD-10-CM | POA: Diagnosis not present

## 2016-09-07 DIAGNOSIS — R413 Other amnesia: Secondary | ICD-10-CM | POA: Diagnosis not present

## 2016-09-07 DIAGNOSIS — G8929 Other chronic pain: Secondary | ICD-10-CM | POA: Diagnosis not present

## 2016-09-07 DIAGNOSIS — M25562 Pain in left knee: Secondary | ICD-10-CM | POA: Diagnosis not present

## 2016-09-07 DIAGNOSIS — M25511 Pain in right shoulder: Secondary | ICD-10-CM

## 2016-09-07 DIAGNOSIS — L989 Disorder of the skin and subcutaneous tissue, unspecified: Secondary | ICD-10-CM | POA: Diagnosis not present

## 2016-09-12 DIAGNOSIS — M1712 Unilateral primary osteoarthritis, left knee: Secondary | ICD-10-CM | POA: Diagnosis not present

## 2016-09-21 DIAGNOSIS — M25511 Pain in right shoulder: Secondary | ICD-10-CM | POA: Diagnosis not present

## 2016-09-21 DIAGNOSIS — M6281 Muscle weakness (generalized): Secondary | ICD-10-CM | POA: Diagnosis not present

## 2016-09-23 DIAGNOSIS — M25511 Pain in right shoulder: Secondary | ICD-10-CM | POA: Diagnosis not present

## 2016-09-23 DIAGNOSIS — M6281 Muscle weakness (generalized): Secondary | ICD-10-CM | POA: Diagnosis not present

## 2016-09-26 DIAGNOSIS — M6281 Muscle weakness (generalized): Secondary | ICD-10-CM | POA: Diagnosis not present

## 2016-09-26 DIAGNOSIS — M25511 Pain in right shoulder: Secondary | ICD-10-CM | POA: Diagnosis not present

## 2016-09-28 DIAGNOSIS — M6281 Muscle weakness (generalized): Secondary | ICD-10-CM | POA: Diagnosis not present

## 2016-09-28 DIAGNOSIS — Z85828 Personal history of other malignant neoplasm of skin: Secondary | ICD-10-CM | POA: Diagnosis not present

## 2016-09-28 DIAGNOSIS — C44729 Squamous cell carcinoma of skin of left lower limb, including hip: Secondary | ICD-10-CM | POA: Diagnosis not present

## 2016-09-28 DIAGNOSIS — M25511 Pain in right shoulder: Secondary | ICD-10-CM | POA: Diagnosis not present

## 2016-09-28 DIAGNOSIS — D1801 Hemangioma of skin and subcutaneous tissue: Secondary | ICD-10-CM | POA: Diagnosis not present

## 2016-09-28 DIAGNOSIS — L821 Other seborrheic keratosis: Secondary | ICD-10-CM | POA: Diagnosis not present

## 2016-09-28 DIAGNOSIS — L814 Other melanin hyperpigmentation: Secondary | ICD-10-CM | POA: Diagnosis not present

## 2016-09-30 DIAGNOSIS — M6281 Muscle weakness (generalized): Secondary | ICD-10-CM | POA: Diagnosis not present

## 2016-09-30 DIAGNOSIS — M25511 Pain in right shoulder: Secondary | ICD-10-CM | POA: Diagnosis not present

## 2016-10-13 DIAGNOSIS — M6281 Muscle weakness (generalized): Secondary | ICD-10-CM | POA: Diagnosis not present

## 2016-10-13 DIAGNOSIS — M25511 Pain in right shoulder: Secondary | ICD-10-CM | POA: Diagnosis not present

## 2016-10-14 DIAGNOSIS — M25511 Pain in right shoulder: Secondary | ICD-10-CM | POA: Diagnosis not present

## 2016-10-14 DIAGNOSIS — M6281 Muscle weakness (generalized): Secondary | ICD-10-CM | POA: Diagnosis not present

## 2016-11-14 DIAGNOSIS — E669 Obesity, unspecified: Secondary | ICD-10-CM | POA: Diagnosis not present

## 2016-11-14 DIAGNOSIS — I10 Essential (primary) hypertension: Secondary | ICD-10-CM | POA: Diagnosis not present

## 2016-11-14 DIAGNOSIS — R413 Other amnesia: Secondary | ICD-10-CM | POA: Diagnosis not present

## 2016-11-14 DIAGNOSIS — Z6830 Body mass index (BMI) 30.0-30.9, adult: Secondary | ICD-10-CM | POA: Diagnosis not present

## 2016-11-16 DIAGNOSIS — L2089 Other atopic dermatitis: Secondary | ICD-10-CM | POA: Diagnosis not present

## 2016-11-16 DIAGNOSIS — Z85828 Personal history of other malignant neoplasm of skin: Secondary | ICD-10-CM | POA: Diagnosis not present

## 2017-01-27 DIAGNOSIS — Z85828 Personal history of other malignant neoplasm of skin: Secondary | ICD-10-CM | POA: Diagnosis not present

## 2017-01-27 DIAGNOSIS — L218 Other seborrheic dermatitis: Secondary | ICD-10-CM | POA: Diagnosis not present

## 2017-01-27 DIAGNOSIS — L820 Inflamed seborrheic keratosis: Secondary | ICD-10-CM | POA: Diagnosis not present

## 2017-01-27 DIAGNOSIS — L308 Other specified dermatitis: Secondary | ICD-10-CM | POA: Diagnosis not present

## 2017-02-14 DIAGNOSIS — Z6830 Body mass index (BMI) 30.0-30.9, adult: Secondary | ICD-10-CM | POA: Diagnosis not present

## 2017-02-14 DIAGNOSIS — E669 Obesity, unspecified: Secondary | ICD-10-CM | POA: Diagnosis not present

## 2017-02-14 DIAGNOSIS — I10 Essential (primary) hypertension: Secondary | ICD-10-CM | POA: Diagnosis not present

## 2017-02-14 DIAGNOSIS — F419 Anxiety disorder, unspecified: Secondary | ICD-10-CM | POA: Diagnosis not present

## 2017-03-14 DIAGNOSIS — L602 Onychogryphosis: Secondary | ICD-10-CM | POA: Diagnosis not present

## 2017-03-14 DIAGNOSIS — Q6689 Other  specified congenital deformities of feet: Secondary | ICD-10-CM | POA: Diagnosis not present

## 2017-03-23 DIAGNOSIS — I10 Essential (primary) hypertension: Secondary | ICD-10-CM | POA: Diagnosis not present

## 2017-03-23 DIAGNOSIS — E039 Hypothyroidism, unspecified: Secondary | ICD-10-CM | POA: Diagnosis not present

## 2017-04-22 DIAGNOSIS — E039 Hypothyroidism, unspecified: Secondary | ICD-10-CM | POA: Diagnosis not present

## 2017-04-22 DIAGNOSIS — I10 Essential (primary) hypertension: Secondary | ICD-10-CM | POA: Diagnosis not present

## 2017-05-04 DIAGNOSIS — I10 Essential (primary) hypertension: Secondary | ICD-10-CM | POA: Diagnosis not present

## 2017-05-04 DIAGNOSIS — R42 Dizziness and giddiness: Secondary | ICD-10-CM | POA: Diagnosis not present

## 2017-05-04 DIAGNOSIS — E039 Hypothyroidism, unspecified: Secondary | ICD-10-CM | POA: Diagnosis not present

## 2017-05-04 DIAGNOSIS — R197 Diarrhea, unspecified: Secondary | ICD-10-CM | POA: Diagnosis not present

## 2017-05-15 DIAGNOSIS — E039 Hypothyroidism, unspecified: Secondary | ICD-10-CM | POA: Diagnosis not present

## 2017-05-15 DIAGNOSIS — I10 Essential (primary) hypertension: Secondary | ICD-10-CM | POA: Diagnosis not present

## 2017-05-30 DIAGNOSIS — M1712 Unilateral primary osteoarthritis, left knee: Secondary | ICD-10-CM | POA: Diagnosis not present

## 2017-06-16 DIAGNOSIS — K602 Anal fissure, unspecified: Secondary | ICD-10-CM | POA: Diagnosis not present

## 2017-06-16 DIAGNOSIS — K921 Melena: Secondary | ICD-10-CM | POA: Diagnosis not present

## 2017-07-25 DIAGNOSIS — Z23 Encounter for immunization: Secondary | ICD-10-CM | POA: Diagnosis not present

## 2017-07-31 DIAGNOSIS — E039 Hypothyroidism, unspecified: Secondary | ICD-10-CM | POA: Diagnosis not present

## 2017-07-31 DIAGNOSIS — Z79899 Other long term (current) drug therapy: Secondary | ICD-10-CM | POA: Diagnosis not present

## 2017-07-31 DIAGNOSIS — Z0000 Encounter for general adult medical examination without abnormal findings: Secondary | ICD-10-CM | POA: Diagnosis not present

## 2017-07-31 DIAGNOSIS — M792 Neuralgia and neuritis, unspecified: Secondary | ICD-10-CM | POA: Diagnosis not present

## 2017-07-31 DIAGNOSIS — I10 Essential (primary) hypertension: Secondary | ICD-10-CM | POA: Diagnosis not present

## 2017-07-31 DIAGNOSIS — Z1389 Encounter for screening for other disorder: Secondary | ICD-10-CM | POA: Diagnosis not present

## 2017-07-31 DIAGNOSIS — Z780 Asymptomatic menopausal state: Secondary | ICD-10-CM | POA: Diagnosis not present

## 2017-09-18 DIAGNOSIS — N390 Urinary tract infection, site not specified: Secondary | ICD-10-CM | POA: Diagnosis not present

## 2017-09-26 DIAGNOSIS — Z780 Asymptomatic menopausal state: Secondary | ICD-10-CM | POA: Diagnosis not present

## 2017-10-05 DIAGNOSIS — N390 Urinary tract infection, site not specified: Secondary | ICD-10-CM | POA: Diagnosis not present

## 2017-10-12 DIAGNOSIS — M1712 Unilateral primary osteoarthritis, left knee: Secondary | ICD-10-CM | POA: Diagnosis not present

## 2017-10-23 DIAGNOSIS — M1712 Unilateral primary osteoarthritis, left knee: Secondary | ICD-10-CM | POA: Diagnosis not present

## 2017-10-25 DIAGNOSIS — H11441 Conjunctival cysts, right eye: Secondary | ICD-10-CM | POA: Diagnosis not present

## 2017-10-30 DIAGNOSIS — M1712 Unilateral primary osteoarthritis, left knee: Secondary | ICD-10-CM | POA: Diagnosis not present

## 2017-12-11 DIAGNOSIS — M25511 Pain in right shoulder: Secondary | ICD-10-CM | POA: Diagnosis not present

## 2017-12-11 DIAGNOSIS — M1712 Unilateral primary osteoarthritis, left knee: Secondary | ICD-10-CM | POA: Diagnosis not present

## 2018-01-31 DIAGNOSIS — Z85828 Personal history of other malignant neoplasm of skin: Secondary | ICD-10-CM | POA: Diagnosis not present

## 2018-01-31 DIAGNOSIS — L570 Actinic keratosis: Secondary | ICD-10-CM | POA: Diagnosis not present

## 2018-01-31 DIAGNOSIS — L720 Epidermal cyst: Secondary | ICD-10-CM | POA: Diagnosis not present

## 2018-01-31 DIAGNOSIS — L821 Other seborrheic keratosis: Secondary | ICD-10-CM | POA: Diagnosis not present

## 2018-01-31 DIAGNOSIS — D2261 Melanocytic nevi of right upper limb, including shoulder: Secondary | ICD-10-CM | POA: Diagnosis not present

## 2018-01-31 DIAGNOSIS — D225 Melanocytic nevi of trunk: Secondary | ICD-10-CM | POA: Diagnosis not present

## 2018-01-31 DIAGNOSIS — L814 Other melanin hyperpigmentation: Secondary | ICD-10-CM | POA: Diagnosis not present

## 2018-01-31 DIAGNOSIS — D2262 Melanocytic nevi of left upper limb, including shoulder: Secondary | ICD-10-CM | POA: Diagnosis not present

## 2018-01-31 DIAGNOSIS — D1801 Hemangioma of skin and subcutaneous tissue: Secondary | ICD-10-CM | POA: Diagnosis not present

## 2018-02-12 DIAGNOSIS — M7581 Other shoulder lesions, right shoulder: Secondary | ICD-10-CM | POA: Diagnosis not present

## 2018-02-13 DIAGNOSIS — I10 Essential (primary) hypertension: Secondary | ICD-10-CM | POA: Diagnosis not present

## 2018-02-13 DIAGNOSIS — E039 Hypothyroidism, unspecified: Secondary | ICD-10-CM | POA: Diagnosis not present

## 2018-02-20 DIAGNOSIS — M25511 Pain in right shoulder: Secondary | ICD-10-CM | POA: Diagnosis not present

## 2018-02-20 DIAGNOSIS — M25562 Pain in left knee: Secondary | ICD-10-CM | POA: Diagnosis not present

## 2018-02-22 DIAGNOSIS — R2689 Other abnormalities of gait and mobility: Secondary | ICD-10-CM | POA: Diagnosis not present

## 2018-02-22 DIAGNOSIS — M25562 Pain in left knee: Secondary | ICD-10-CM | POA: Diagnosis not present

## 2018-02-22 DIAGNOSIS — M6281 Muscle weakness (generalized): Secondary | ICD-10-CM | POA: Diagnosis not present

## 2018-02-22 DIAGNOSIS — M1712 Unilateral primary osteoarthritis, left knee: Secondary | ICD-10-CM | POA: Diagnosis not present

## 2018-02-23 DIAGNOSIS — M25562 Pain in left knee: Secondary | ICD-10-CM | POA: Diagnosis not present

## 2018-02-23 DIAGNOSIS — M6281 Muscle weakness (generalized): Secondary | ICD-10-CM | POA: Diagnosis not present

## 2018-02-23 DIAGNOSIS — M1712 Unilateral primary osteoarthritis, left knee: Secondary | ICD-10-CM | POA: Diagnosis not present

## 2018-02-23 DIAGNOSIS — R2689 Other abnormalities of gait and mobility: Secondary | ICD-10-CM | POA: Diagnosis not present

## 2018-02-25 DIAGNOSIS — M25562 Pain in left knee: Secondary | ICD-10-CM | POA: Diagnosis not present

## 2018-02-25 DIAGNOSIS — M1712 Unilateral primary osteoarthritis, left knee: Secondary | ICD-10-CM | POA: Diagnosis not present

## 2018-02-25 DIAGNOSIS — M6281 Muscle weakness (generalized): Secondary | ICD-10-CM | POA: Diagnosis not present

## 2018-02-25 DIAGNOSIS — R2689 Other abnormalities of gait and mobility: Secondary | ICD-10-CM | POA: Diagnosis not present

## 2018-02-28 DIAGNOSIS — R2689 Other abnormalities of gait and mobility: Secondary | ICD-10-CM | POA: Diagnosis not present

## 2018-02-28 DIAGNOSIS — M25562 Pain in left knee: Secondary | ICD-10-CM | POA: Diagnosis not present

## 2018-02-28 DIAGNOSIS — M6281 Muscle weakness (generalized): Secondary | ICD-10-CM | POA: Diagnosis not present

## 2018-02-28 DIAGNOSIS — M1712 Unilateral primary osteoarthritis, left knee: Secondary | ICD-10-CM | POA: Diagnosis not present

## 2018-03-02 DIAGNOSIS — R2689 Other abnormalities of gait and mobility: Secondary | ICD-10-CM | POA: Diagnosis not present

## 2018-03-02 DIAGNOSIS — M25562 Pain in left knee: Secondary | ICD-10-CM | POA: Diagnosis not present

## 2018-03-02 DIAGNOSIS — M6281 Muscle weakness (generalized): Secondary | ICD-10-CM | POA: Diagnosis not present

## 2018-03-02 DIAGNOSIS — M1712 Unilateral primary osteoarthritis, left knee: Secondary | ICD-10-CM | POA: Diagnosis not present

## 2018-03-06 DIAGNOSIS — R2689 Other abnormalities of gait and mobility: Secondary | ICD-10-CM | POA: Diagnosis not present

## 2018-03-06 DIAGNOSIS — M25562 Pain in left knee: Secondary | ICD-10-CM | POA: Diagnosis not present

## 2018-03-06 DIAGNOSIS — M6281 Muscle weakness (generalized): Secondary | ICD-10-CM | POA: Diagnosis not present

## 2018-03-06 DIAGNOSIS — M1712 Unilateral primary osteoarthritis, left knee: Secondary | ICD-10-CM | POA: Diagnosis not present

## 2018-03-09 DIAGNOSIS — M6281 Muscle weakness (generalized): Secondary | ICD-10-CM | POA: Diagnosis not present

## 2018-03-09 DIAGNOSIS — M1712 Unilateral primary osteoarthritis, left knee: Secondary | ICD-10-CM | POA: Diagnosis not present

## 2018-03-09 DIAGNOSIS — R2689 Other abnormalities of gait and mobility: Secondary | ICD-10-CM | POA: Diagnosis not present

## 2018-03-09 DIAGNOSIS — M25562 Pain in left knee: Secondary | ICD-10-CM | POA: Diagnosis not present

## 2018-03-12 DIAGNOSIS — M25562 Pain in left knee: Secondary | ICD-10-CM | POA: Diagnosis not present

## 2018-03-12 DIAGNOSIS — M6281 Muscle weakness (generalized): Secondary | ICD-10-CM | POA: Diagnosis not present

## 2018-03-12 DIAGNOSIS — R2689 Other abnormalities of gait and mobility: Secondary | ICD-10-CM | POA: Diagnosis not present

## 2018-03-12 DIAGNOSIS — M1712 Unilateral primary osteoarthritis, left knee: Secondary | ICD-10-CM | POA: Diagnosis not present

## 2018-03-14 DIAGNOSIS — M25562 Pain in left knee: Secondary | ICD-10-CM | POA: Diagnosis not present

## 2018-03-14 DIAGNOSIS — M6281 Muscle weakness (generalized): Secondary | ICD-10-CM | POA: Diagnosis not present

## 2018-03-14 DIAGNOSIS — R2689 Other abnormalities of gait and mobility: Secondary | ICD-10-CM | POA: Diagnosis not present

## 2018-03-14 DIAGNOSIS — M1712 Unilateral primary osteoarthritis, left knee: Secondary | ICD-10-CM | POA: Diagnosis not present

## 2018-03-16 DIAGNOSIS — M25562 Pain in left knee: Secondary | ICD-10-CM | POA: Diagnosis not present

## 2018-03-16 DIAGNOSIS — R2689 Other abnormalities of gait and mobility: Secondary | ICD-10-CM | POA: Diagnosis not present

## 2018-03-16 DIAGNOSIS — M6281 Muscle weakness (generalized): Secondary | ICD-10-CM | POA: Diagnosis not present

## 2018-03-16 DIAGNOSIS — M1712 Unilateral primary osteoarthritis, left knee: Secondary | ICD-10-CM | POA: Diagnosis not present

## 2018-03-26 DIAGNOSIS — M7581 Other shoulder lesions, right shoulder: Secondary | ICD-10-CM | POA: Diagnosis not present

## 2018-04-03 DIAGNOSIS — M6281 Muscle weakness (generalized): Secondary | ICD-10-CM | POA: Diagnosis not present

## 2018-04-03 DIAGNOSIS — R2689 Other abnormalities of gait and mobility: Secondary | ICD-10-CM | POA: Diagnosis not present

## 2018-04-03 DIAGNOSIS — M1712 Unilateral primary osteoarthritis, left knee: Secondary | ICD-10-CM | POA: Diagnosis not present

## 2018-04-03 DIAGNOSIS — M25562 Pain in left knee: Secondary | ICD-10-CM | POA: Diagnosis not present

## 2018-04-04 DIAGNOSIS — M6281 Muscle weakness (generalized): Secondary | ICD-10-CM | POA: Diagnosis not present

## 2018-04-04 DIAGNOSIS — M25562 Pain in left knee: Secondary | ICD-10-CM | POA: Diagnosis not present

## 2018-04-04 DIAGNOSIS — R2689 Other abnormalities of gait and mobility: Secondary | ICD-10-CM | POA: Diagnosis not present

## 2018-04-04 DIAGNOSIS — M1712 Unilateral primary osteoarthritis, left knee: Secondary | ICD-10-CM | POA: Diagnosis not present

## 2018-04-06 DIAGNOSIS — R2689 Other abnormalities of gait and mobility: Secondary | ICD-10-CM | POA: Diagnosis not present

## 2018-04-06 DIAGNOSIS — M6281 Muscle weakness (generalized): Secondary | ICD-10-CM | POA: Diagnosis not present

## 2018-04-06 DIAGNOSIS — M25562 Pain in left knee: Secondary | ICD-10-CM | POA: Diagnosis not present

## 2018-04-06 DIAGNOSIS — M1712 Unilateral primary osteoarthritis, left knee: Secondary | ICD-10-CM | POA: Diagnosis not present

## 2018-04-10 DIAGNOSIS — R2689 Other abnormalities of gait and mobility: Secondary | ICD-10-CM | POA: Diagnosis not present

## 2018-04-10 DIAGNOSIS — M25562 Pain in left knee: Secondary | ICD-10-CM | POA: Diagnosis not present

## 2018-04-10 DIAGNOSIS — M6281 Muscle weakness (generalized): Secondary | ICD-10-CM | POA: Diagnosis not present

## 2018-04-10 DIAGNOSIS — M1712 Unilateral primary osteoarthritis, left knee: Secondary | ICD-10-CM | POA: Diagnosis not present

## 2018-04-13 DIAGNOSIS — M6281 Muscle weakness (generalized): Secondary | ICD-10-CM | POA: Diagnosis not present

## 2018-04-13 DIAGNOSIS — R2689 Other abnormalities of gait and mobility: Secondary | ICD-10-CM | POA: Diagnosis not present

## 2018-04-13 DIAGNOSIS — M1712 Unilateral primary osteoarthritis, left knee: Secondary | ICD-10-CM | POA: Diagnosis not present

## 2018-04-13 DIAGNOSIS — M25562 Pain in left knee: Secondary | ICD-10-CM | POA: Diagnosis not present

## 2018-04-16 DIAGNOSIS — M25562 Pain in left knee: Secondary | ICD-10-CM | POA: Diagnosis not present

## 2018-04-16 DIAGNOSIS — M1712 Unilateral primary osteoarthritis, left knee: Secondary | ICD-10-CM | POA: Diagnosis not present

## 2018-04-16 DIAGNOSIS — M6281 Muscle weakness (generalized): Secondary | ICD-10-CM | POA: Diagnosis not present

## 2018-04-16 DIAGNOSIS — R2689 Other abnormalities of gait and mobility: Secondary | ICD-10-CM | POA: Diagnosis not present

## 2018-05-03 DIAGNOSIS — I10 Essential (primary) hypertension: Secondary | ICD-10-CM | POA: Diagnosis not present

## 2018-05-03 DIAGNOSIS — J309 Allergic rhinitis, unspecified: Secondary | ICD-10-CM | POA: Diagnosis not present

## 2018-05-03 DIAGNOSIS — R351 Nocturia: Secondary | ICD-10-CM | POA: Diagnosis not present

## 2018-07-31 ENCOUNTER — Other Ambulatory Visit: Payer: Self-pay | Admitting: Geriatric Medicine

## 2018-07-31 DIAGNOSIS — Z23 Encounter for immunization: Secondary | ICD-10-CM | POA: Diagnosis not present

## 2018-07-31 DIAGNOSIS — N643 Galactorrhea not associated with childbirth: Secondary | ICD-10-CM | POA: Diagnosis not present

## 2018-07-31 DIAGNOSIS — I10 Essential (primary) hypertension: Secondary | ICD-10-CM | POA: Diagnosis not present

## 2018-08-06 ENCOUNTER — Ambulatory Visit
Admission: RE | Admit: 2018-08-06 | Discharge: 2018-08-06 | Disposition: A | Discharge status: Home / Self Care | Payer: Medicare Other | Source: Ambulatory Visit | Attending: Geriatric Medicine | Admitting: Geriatric Medicine

## 2018-08-06 ENCOUNTER — Other Ambulatory Visit: Payer: Self-pay | Admitting: Geriatric Medicine

## 2018-08-06 DIAGNOSIS — N6310 Unspecified lump in the right breast, unspecified quadrant: Secondary | ICD-10-CM

## 2018-08-06 DIAGNOSIS — N6311 Unspecified lump in the right breast, upper outer quadrant: Secondary | ICD-10-CM | POA: Diagnosis not present

## 2018-08-06 DIAGNOSIS — N643 Galactorrhea not associated with childbirth: Secondary | ICD-10-CM

## 2018-08-06 DIAGNOSIS — R928 Other abnormal and inconclusive findings on diagnostic imaging of breast: Secondary | ICD-10-CM | POA: Diagnosis not present

## 2018-08-06 DIAGNOSIS — N6313 Unspecified lump in the right breast, lower outer quadrant: Secondary | ICD-10-CM | POA: Diagnosis not present

## 2018-08-13 ENCOUNTER — Ambulatory Visit
Admission: RE | Admit: 2018-08-13 | Discharge: 2018-08-13 | Disposition: A | Discharge status: Home / Self Care | Payer: Medicare Other | Source: Ambulatory Visit | Attending: Geriatric Medicine | Admitting: Geriatric Medicine

## 2018-08-13 ENCOUNTER — Other Ambulatory Visit: Payer: Self-pay | Admitting: Surgery

## 2018-08-13 DIAGNOSIS — N6310 Unspecified lump in the right breast, unspecified quadrant: Secondary | ICD-10-CM

## 2018-08-13 DIAGNOSIS — N6341 Unspecified lump in right breast, subareolar: Secondary | ICD-10-CM | POA: Diagnosis not present

## 2018-08-13 DIAGNOSIS — N649 Disorder of breast, unspecified: Secondary | ICD-10-CM | POA: Diagnosis not present

## 2018-08-14 DIAGNOSIS — D241 Benign neoplasm of right breast: Secondary | ICD-10-CM | POA: Diagnosis not present

## 2018-08-16 ENCOUNTER — Ambulatory Visit: Payer: Self-pay | Admitting: Surgery

## 2018-08-16 DIAGNOSIS — D241 Benign neoplasm of right breast: Secondary | ICD-10-CM

## 2018-08-17 ENCOUNTER — Other Ambulatory Visit: Payer: Self-pay | Admitting: Surgery

## 2018-08-17 DIAGNOSIS — D241 Benign neoplasm of right breast: Secondary | ICD-10-CM

## 2018-08-30 NOTE — Pre-Procedure Instructions (Signed)
Jamie Mahoney  08/30/2018      [place], [place] - [place], [id number], [address] [place] [address] Phone: [phone] Fax: [phone]    Your procedure is scheduled on Friday November 15.  Report to [facility] Admitting at 11:30 A.M.  Call this number if you have problems the morning of surgery:  [phone]   Remember:  Do not eat or drink after midnight.  You may drink clear liquids until 10:30 (3 hours prior to surgery) .  Clear liquids allowed are: Water, Juice (non-citric and without pulp), Clear Tea, Black Coffee only and Gatorade    Take these medicines the morning of surgery with A SIP OF WATER:   Amlodipine (norvasc) Levothyroxine (synthroid) Nasocort Acetaminophen (tylenol) if needed  7 days prior to surgery STOP taking any Aspirin(unless otherwise instructed by your surgeon), Aleve, Naproxen, Ibuprofen, Motrin, Advil, Goody's, BC's, all herbal medications, fish oil, and all vitamins     Do not wear jewelry, make-up or nail polish.  Do not wear lotions, powders, or perfumes, or deodorant.  Do not shave 48 hours prior to surgery.  Men may shave face and neck.  Do not bring valuables to the hospital.  [facility] is not responsible for any belongings or valuables.  Contacts, dentures or bridgework may not be worn into surgery.  Leave your suitcase in the car.  After surgery it may be brought to your room.  For patients admitted to the hospital, discharge time will be determined by your treatment team.  Patients discharged the day of surgery will not be allowed to drive home.    Special instructions:    [facility]- Preparing For Surgery  Before surgery, you can play an important role. Because skin is not sterile, your skin needs to be as free of germs as possible. You can reduce the number of germs on your skin by washing with CHG (chlorahexidine gluconate) Soap before surgery.   CHG is an antiseptic cleaner which kills germs and bonds with the skin to continue killing germs even after washing.    Oral Hygiene is also important to reduce your risk of infection.  Remember - BRUSH YOUR TEETH THE MORNING OF SURGERY WITH YOUR REGULAR TOOTHPASTE  Please do not use if you have an allergy to CHG or antibacterial soaps. If your skin becomes reddened/irritated stop using the CHG.  Do not shave (including legs and underarms) for at least 48 hours prior to first CHG shower. It is OK to shave your face.  Please follow these instructions carefully.   1. Shower the NIGHT BEFORE SURGERY and the MORNING OF SURGERY with CHG.   2. If you chose to wash your hair, wash your hair first as usual with your normal shampoo.  3. After you shampoo, rinse your hair and body thoroughly to remove the shampoo.  4. Use CHG as you would any other liquid soap. You can apply CHG directly to the skin and wash gently with a scrungie or a clean washcloth.   5. Apply the CHG Soap to your body ONLY FROM THE NECK DOWN.  Do not use on open wounds or open sores. Avoid contact with your eyes, ears, mouth and genitals (private parts). Wash Face and genitals (private parts)  with your normal soap.  6. Wash thoroughly, paying special attention to the area where your surgery will be performed.  7. Thoroughly rinse your body with warm water from  the neck down.  8. DO NOT shower/wash with your normal soap after using and rinsing off the CHG Soap.  9. Pat yourself dry with a CLEAN TOWEL.  10. Wear CLEAN PAJAMAS to bed the night before surgery, wear comfortable clothes the morning of surgery  11. Place CLEAN SHEETS on your bed the night of your first shower and DO NOT SLEEP WITH PETS.    Day of Surgery:  Do not apply any deodorants/lotions.  Please wear clean clothes to the hospital/surgery center.   Remember to brush your teeth WITH YOUR REGULAR TOOTHPASTE.    Please read over the following fact  sheets that you were given. Coughing and Deep Breathing and Surgical Site Infection Prevention

## 2018-08-31 ENCOUNTER — Other Ambulatory Visit: Payer: Self-pay

## 2018-08-31 ENCOUNTER — Encounter (HOSPITAL_COMMUNITY)
Admission: RE | Admit: 2018-08-31 | Discharge: 2018-08-31 | Disposition: A | Discharge status: Home / Self Care | Payer: Medicare Other | Source: Ambulatory Visit | Attending: Surgery | Admitting: Surgery

## 2018-08-31 ENCOUNTER — Encounter (HOSPITAL_COMMUNITY): Payer: Self-pay

## 2018-08-31 DIAGNOSIS — I444 Left anterior fascicular block: Secondary | ICD-10-CM | POA: Insufficient documentation

## 2018-08-31 DIAGNOSIS — Z01818 Encounter for other preprocedural examination: Secondary | ICD-10-CM | POA: Insufficient documentation

## 2018-08-31 DIAGNOSIS — R9431 Abnormal electrocardiogram [ECG] [EKG]: Secondary | ICD-10-CM | POA: Insufficient documentation

## 2018-08-31 LAB — CBC
HCT: 43.2 % (ref 36.0–46.0)
HEMOGLOBIN: 13.9 g/dL (ref 12.0–15.0)
MCH: 31.3 pg (ref 26.0–34.0)
MCHC: 32.2 g/dL (ref 30.0–36.0)
MCV: 97.3 fL (ref 80.0–100.0)
NRBC: 0 % (ref 0.0–0.2)
PLATELETS: 218 10*3/uL (ref 150–400)
RBC: 4.44 MIL/uL (ref 3.87–5.11)
RDW: 13.1 % (ref 11.5–15.5)
WBC: 7.5 10*3/uL (ref 4.0–10.5)

## 2018-08-31 LAB — BASIC METABOLIC PANEL
ANION GAP: 5 (ref 5–15)
BUN: 14 mg/dL (ref 8–23)
CALCIUM: 8.5 mg/dL — AB (ref 8.9–10.3)
CO2: 27 mmol/L (ref 22–32)
Chloride: 113 mmol/L — ABNORMAL HIGH (ref 98–111)
Creatinine, Ser: 1.16 mg/dL — ABNORMAL HIGH (ref 0.44–1.00)
GFR calc non Af Amer: 41 mL/min — ABNORMAL LOW (ref >60)
GFR, EST AFRICAN AMERICAN: 48 mL/min — AB (ref >60)
Glucose, Bld: 102 mg/dL — ABNORMAL HIGH (ref 70–99)
POTASSIUM: 3.9 mmol/L (ref 3.5–5.1)
Sodium: 145 mmol/L (ref 135–145)

## 2018-08-31 NOTE — Progress Notes (Addendum)
PCP: Lajean Manes, MD  Cardiologist: pt denies  EKG: 08/31/18 at PAT appt  Stress test: pt denies  ECHO: pt denies  Cardiac Cath: pt denies  Chest x-ray: denies past year, no recent respiratory infections/complications

## 2018-09-05 ENCOUNTER — Ambulatory Visit
Admission: RE | Admit: 2018-09-05 | Discharge: 2018-09-05 | Disposition: A | Discharge status: Home / Self Care | Payer: Medicare Other | Source: Ambulatory Visit | Attending: Surgery | Admitting: Surgery

## 2018-09-05 DIAGNOSIS — D241 Benign neoplasm of right breast: Secondary | ICD-10-CM

## 2018-09-05 DIAGNOSIS — R928 Other abnormal and inconclusive findings on diagnostic imaging of breast: Secondary | ICD-10-CM | POA: Diagnosis not present

## 2018-09-06 NOTE — H&P (Signed)
Jamie Mahoney  Location: [place] Office Patient #: [id number] DOB: [birth year] Married / Language: English / Race: White Female  History of Present Illness   The patient is a 84 year old female who presents with a complaint of right breast mass.  The PCP is Dr. Particia Nearing  The patient was referred by Dr. Particia Nearing  She came by herself.  About 2 weeks ago she noticed a clear to whitish right nipple discharge. She had her last mammograms over 8 years ago. She has noticed no mass in her breast. She's had no prior history of breast biopsy. She has no family history of breast cancer. She has not been on female hormones.  She underwent a mammogram and ultrasound on 06 August 2018 which showed a 1.5 x 0.5 cm intraductal mass at the 9 o'clock position of the right breast. She had a right breast biopsy on 08/13/2018 ([id number]) that showed a papillary lesion with atypia. I gave her a copy of the path report.  I reviewed with her the findings of the pathology. Even though she is 70, she is in good health. It makes sense to remove this papilloma. I discussed a seed localization. I discussed the risks of infection, bleeding, nerve injury. I also discussed the possbility of the tumor harboring a cancer, but we will deal with that on the final pathology.  Review of Systems as stated in this history (HPI) or in the review of systems. Otherwise all other 12 point ROS are negative  Past Medical History: 1. HTN 2. Neuralgia headaches - about once a month On gabapentin  3. Thyroid replacement 4. Allergy to contrast dye 5. Hysterectomy - 1969  Social History: Married. Husband [name] has dementia. They live at [facility] in [place]. She has two living children - on in [place] and one in [place]. (she has one son that died)   Allergies (April Staton, [place]; 08/14/2018 3:53 PM) Iodinated Diagnostic Agents   Medication  History (April Staton, CMA; 08/14/2018 3:54 PM) Levothyroxine Sodium (50MCG Tablet, Oral) Active. amLODIPine Besylate (5MG  Tablet, Oral) Active. Medications Reconciled  Vitals (April Staton CMA; 08/14/2018 3:54 PM) 08/14/2018 3:54 PM Weight: 168.5 lb Height: 62in Body Surface Area: 1.78 m Body Mass Index: 30.82 kg/m  Temp.: 97.25F(Oral)  Pulse: 71 (Regular)  BP: 142/80 (Sitting, Left Arm, Standard)   Physical Exam  General: WN older WF who is alert and generally healthy appearing. She is very pleasant. HEENT: Normal. Pupils equal.  Neck: Supple. No mass. No thyroid mass. Lymph Nodes: No supraclavicular or cervical nodes.  Lungs: Clear to auscultation and symmetric breath sounds. Heart: RRR. No murmur or rub. Occasional PVC.  Breasts: Right - band aid and tender at 9 o'clock. I could not feel a mass, but she was tender, which limited the PE  Left - no mass  Abdomen: Soft. No mass. No tenderness. No hernia. Normal bowel sounds.  Extremities: Good strength and ROM in upper and lower extremities.  Neurologic: Grossly intact to motor and sensory function. Psychiatric: Has normal mood and affect. Behavior is normal.    Assessment & Plan  1.  PAPILLOMA OF RIGHT BREAST (D24.1)  Plan:  1) Right breast lumpectomy (seed localization)  2. HTN 3. Neuralgia headaches - about once a month  On gabapentin 4. Thyroid replacement   Jamie Overall, MD, [facility] Surgery Pager: [phone] Office phone:  [phone]

## 2018-09-07 ENCOUNTER — Ambulatory Visit
Admission: RE | Admit: 2018-09-07 | Discharge: 2018-09-07 | Disposition: A | Discharge status: Home / Self Care | Payer: Medicare Other | Source: Ambulatory Visit | Attending: Surgery | Admitting: Surgery

## 2018-09-07 ENCOUNTER — Ambulatory Visit (HOSPITAL_COMMUNITY): Payer: Medicare Other | Admitting: Certified Registered"

## 2018-09-07 ENCOUNTER — Ambulatory Visit (HOSPITAL_COMMUNITY)
Admission: RE | Admit: 2018-09-07 | Discharge: 2018-09-07 | Disposition: A | Discharge status: Home / Self Care | Payer: Medicare Other | Source: Ambulatory Visit | Attending: Surgery | Admitting: Surgery

## 2018-09-07 ENCOUNTER — Encounter (HOSPITAL_COMMUNITY): Payer: Self-pay

## 2018-09-07 ENCOUNTER — Encounter (HOSPITAL_COMMUNITY)
Admission: RE | Disposition: A | Discharge status: Home / Self Care | Payer: Self-pay | Source: Ambulatory Visit | Attending: Surgery

## 2018-09-07 DIAGNOSIS — D241 Benign neoplasm of right breast: Secondary | ICD-10-CM | POA: Insufficient documentation

## 2018-09-07 DIAGNOSIS — E039 Hypothyroidism, unspecified: Secondary | ICD-10-CM | POA: Diagnosis not present

## 2018-09-07 DIAGNOSIS — M792 Neuralgia and neuritis, unspecified: Secondary | ICD-10-CM | POA: Diagnosis not present

## 2018-09-07 DIAGNOSIS — I10 Essential (primary) hypertension: Secondary | ICD-10-CM | POA: Diagnosis not present

## 2018-09-07 DIAGNOSIS — Z79899 Other long term (current) drug therapy: Secondary | ICD-10-CM | POA: Insufficient documentation

## 2018-09-07 DIAGNOSIS — M797 Fibromyalgia: Secondary | ICD-10-CM | POA: Diagnosis not present

## 2018-09-07 DIAGNOSIS — R928 Other abnormal and inconclusive findings on diagnostic imaging of breast: Secondary | ICD-10-CM | POA: Diagnosis not present

## 2018-09-07 DIAGNOSIS — D0511 Intraductal carcinoma in situ of right breast: Secondary | ICD-10-CM | POA: Diagnosis not present

## 2018-09-07 HISTORY — PX: BREAST LUMPECTOMY WITH RADIOACTIVE SEED LOCALIZATION: SHX6424

## 2018-09-07 HISTORY — PX: BREAST LUMPECTOMY: SHX2

## 2018-09-07 SURGERY — BREAST LUMPECTOMY WITH RADIOACTIVE SEED LOCALIZATION
Anesthesia: General | Site: Breast | Laterality: Right

## 2018-09-07 MED ORDER — CEFAZOLIN SODIUM-DEXTROSE 2-4 GM/100ML-% IV SOLN
INTRAVENOUS | Status: AC
  Filled 2018-09-07: qty 100

## 2018-09-07 MED ORDER — BUPIVACAINE-EPINEPHRINE (PF) 0.25% -1:200000 IJ SOLN
INTRAMUSCULAR | Status: AC
  Filled 2018-09-07: qty 30

## 2018-09-07 MED ORDER — PROPOFOL 10 MG/ML IV BOLUS
INTRAVENOUS | Status: DC | PRN
  Administered 2018-09-07: 200 mg via INTRAVENOUS

## 2018-09-07 MED ORDER — ACETAMINOPHEN 500 MG PO TABS
1000.0000 mg | ORAL_TABLET | ORAL | Status: AC
  Administered 2018-09-07: 1000 mg via ORAL

## 2018-09-07 MED ORDER — FENTANYL CITRATE (PF) 250 MCG/5ML IJ SOLN
INTRAMUSCULAR | Status: AC
  Filled 2018-09-07: qty 5

## 2018-09-07 MED ORDER — GABAPENTIN 300 MG PO CAPS
300.0000 mg | ORAL_CAPSULE | ORAL | Status: AC
  Administered 2018-09-07: 300 mg via ORAL

## 2018-09-07 MED ORDER — LIDOCAINE 2% (20 MG/ML) 5 ML SYRINGE
INTRAMUSCULAR | Status: DC | PRN
  Administered 2018-09-07: 100 mg via INTRAVENOUS

## 2018-09-07 MED ORDER — CHLORHEXIDINE GLUCONATE CLOTH 2 % EX PADS: 6.0000 | MEDICATED_PAD | Freq: Once | CUTANEOUS | Status: DC

## 2018-09-07 MED ORDER — ROCURONIUM BROMIDE 50 MG/5ML IV SOSY
PREFILLED_SYRINGE | INTRAVENOUS | Status: AC
  Filled 2018-09-07: qty 5

## 2018-09-07 MED ORDER — LACTATED RINGERS IV SOLN
INTRAVENOUS | Status: DC
  Administered 2018-09-07: 12:00:00 via INTRAVENOUS

## 2018-09-07 MED ORDER — BUPIVACAINE-EPINEPHRINE 0.25% -1:200000 IJ SOLN
INTRAMUSCULAR | Status: DC | PRN
  Administered 2018-09-07: 30 mL

## 2018-09-07 MED ORDER — HYDROCODONE-ACETAMINOPHEN 5-325 MG PO TABS: 1.0000 | ORAL_TABLET | Freq: Four times a day (QID) | ORAL | 0 refills | Status: DC | PRN

## 2018-09-07 MED ORDER — CEFAZOLIN SODIUM-DEXTROSE 2-4 GM/100ML-% IV SOLN
2.0000 g | INTRAVENOUS | Status: AC
  Administered 2018-09-07: 2 g via INTRAVENOUS

## 2018-09-07 MED ORDER — GLYCOPYRROLATE PF 0.2 MG/ML IJ SOSY
PREFILLED_SYRINGE | INTRAMUSCULAR | Status: AC
  Filled 2018-09-07: qty 1

## 2018-09-07 MED ORDER — 0.9 % SODIUM CHLORIDE (POUR BTL) OPTIME
TOPICAL | Status: DC | PRN
  Administered 2018-09-07: 1000 mL

## 2018-09-07 MED ORDER — FENTANYL CITRATE (PF) 250 MCG/5ML IJ SOLN
INTRAMUSCULAR | Status: DC | PRN
  Administered 2018-09-07 (×3): 25 ug via INTRAVENOUS

## 2018-09-07 MED ORDER — ACETAMINOPHEN 500 MG PO TABS
ORAL_TABLET | ORAL | Status: AC
  Administered 2018-09-07: 1000 mg via ORAL
  Filled 2018-09-07: qty 2

## 2018-09-07 MED ORDER — GABAPENTIN 300 MG PO CAPS
ORAL_CAPSULE | ORAL | Status: AC
  Administered 2018-09-07: 300 mg via ORAL
  Filled 2018-09-07: qty 1

## 2018-09-07 MED ORDER — PROPOFOL 10 MG/ML IV BOLUS
INTRAVENOUS | Status: AC
  Filled 2018-09-07: qty 20

## 2018-09-07 MED ORDER — DEXAMETHASONE SODIUM PHOSPHATE 10 MG/ML IJ SOLN
INTRAMUSCULAR | Status: DC | PRN
  Administered 2018-09-07: 10 mg via INTRAVENOUS

## 2018-09-07 MED ORDER — LIDOCAINE 2% (20 MG/ML) 5 ML SYRINGE
INTRAMUSCULAR | Status: AC
  Filled 2018-09-07: qty 5

## 2018-09-07 MED ORDER — ONDANSETRON HCL 4 MG/2ML IJ SOLN
INTRAMUSCULAR | Status: DC | PRN
  Administered 2018-09-07: 4 mg via INTRAVENOUS

## 2018-09-07 SURGICAL SUPPLY — 42 items
BINDER BREAST XLRG (GAUZE/BANDAGES/DRESSINGS) ×2 IMPLANT
BLADE SURG 15 STRL LF DISP TIS (BLADE) ×1 IMPLANT
BLADE SURG 15 STRL SS (BLADE) ×1
CANISTER SUCT 3000ML PPV (MISCELLANEOUS) IMPLANT
CHLORAPREP W/TINT 26ML (MISCELLANEOUS) ×2 IMPLANT
CLIP VESOCCLUDE SM WIDE 6/CT (CLIP) ×2 IMPLANT
COVER PROBE W GEL 5X96 (DRAPES) ×2 IMPLANT
COVER SURGICAL LIGHT HANDLE (MISCELLANEOUS) ×2 IMPLANT
COVER WAND RF STERILE (DRAPES) ×2 IMPLANT
DECANTER SPIKE VIAL GLASS SM (MISCELLANEOUS) ×2 IMPLANT
DERMABOND ADVANCED (GAUZE/BANDAGES/DRESSINGS) ×1
DERMABOND ADVANCED .7 DNX12 (GAUZE/BANDAGES/DRESSINGS) ×1 IMPLANT
DEVICE DUBIN SPECIMEN MAMMOGRA (MISCELLANEOUS) ×2 IMPLANT
DRAPE CHEST BREAST 15X10 FENES (DRAPES) ×2 IMPLANT
DRAPE UTILITY XL STRL (DRAPES) ×2 IMPLANT
DRSG PAD ABDOMINAL 8X10 ST (GAUZE/BANDAGES/DRESSINGS) ×2 IMPLANT
ELECT COATED BLADE 2.86 ST (ELECTRODE) ×2 IMPLANT
ELECT REM PT RETURN 9FT ADLT (ELECTROSURGICAL) ×2
ELECTRODE REM PT RTRN 9FT ADLT (ELECTROSURGICAL) ×1 IMPLANT
GAUZE SPONGE 4X4 12PLY STRL (GAUZE/BANDAGES/DRESSINGS) ×2 IMPLANT
GLOVE SURG SIGNA 7.5 PF LTX (GLOVE) ×2 IMPLANT
GOWN STRL REUS W/ TWL LRG LVL3 (GOWN DISPOSABLE) ×1 IMPLANT
GOWN STRL REUS W/ TWL XL LVL3 (GOWN DISPOSABLE) ×1 IMPLANT
GOWN STRL REUS W/TWL LRG LVL3 (GOWN DISPOSABLE) ×1
GOWN STRL REUS W/TWL XL LVL3 (GOWN DISPOSABLE) ×1
ILLUMINATOR WAVEGUIDE N/F (MISCELLANEOUS) IMPLANT
KIT BASIN OR (CUSTOM PROCEDURE TRAY) ×2 IMPLANT
KIT MARKER MARGIN INK (KITS) ×2 IMPLANT
LIGHT WAVEGUIDE WIDE FLAT (MISCELLANEOUS) IMPLANT
NEEDLE HYPO 25GX1X1/2 BEV (NEEDLE) ×2 IMPLANT
NS IRRIG 1000ML POUR BTL (IV SOLUTION) ×2 IMPLANT
PACK SURGICAL SETUP 50X90 (CUSTOM PROCEDURE TRAY) ×2 IMPLANT
PENCIL BUTTON HOLSTER BLD 10FT (ELECTRODE) ×2 IMPLANT
SPONGE LAP 18X18 RF (DISPOSABLE) ×4 IMPLANT
SPONGE LAP 18X18 X RAY DECT (DISPOSABLE) ×2 IMPLANT
SUT MNCRL AB 4-0 PS2 18 (SUTURE) ×2 IMPLANT
SUT VIC AB 3-0 SH 18 (SUTURE) ×2 IMPLANT
SYR BULB 3OZ (MISCELLANEOUS) ×2 IMPLANT
SYR CONTROL 10ML LL (SYRINGE) ×2 IMPLANT
TOWEL GREEN STERILE (TOWEL DISPOSABLE) ×2 IMPLANT
TUBE CONNECTING 12X1/4 (SUCTIONS) ×2 IMPLANT
YANKAUER SUCT BULB TIP NO VENT (SUCTIONS) IMPLANT

## 2018-09-07 NOTE — Anesthesia Procedure Notes (Signed)
Procedure Name: LMA Insertion Date/Time: 09/07/2018 2:20 PM Performed by: Cleda Daub, CRNA Pre-anesthesia Checklist: Patient identified, Emergency Drugs available, Suction available and Patient being monitored Patient Re-evaluated:Patient Re-evaluated prior to induction Oxygen Delivery Method: Circle system utilized Preoxygenation: Pre-oxygenation with 100% oxygen Induction Type: IV induction LMA: LMA inserted LMA Size: 4.0 Number of attempts: 1 Placement Confirmation: ETT inserted through vocal cords under direct vision,  positive ETCO2 and breath sounds checked- equal and bilateral Tube secured with: Tape Dental Injury: Teeth and Oropharynx as per pre-operative assessment

## 2018-09-07 NOTE — Transfer of Care (Signed)
Immediate Anesthesia Transfer of Care Note  Patient: Jamie Mahoney  Procedure(s) Performed: RIGHT BREAST LUMPECTOMY WITH RADIOACTIVE SEED LOCALIZATION (Right Breast)  Patient Location: PACU  Anesthesia Type:General  Level of Consciousness: awake, alert , oriented and patient cooperative  Airway & Oxygen Therapy: Patient Spontanous Breathing and Patient connected to face mask oxygen  Post-op Assessment: Report given to RN and Post -op Vital signs reviewed and stable  Post vital signs: Reviewed and stable  Last Vitals:  Vitals Value Taken Time  BP 158/77 09/07/2018  3:27 PM  Temp    Pulse 70 09/07/2018  3:27 PM  Resp 17 09/07/2018  3:27 PM  SpO2 98 % 09/07/2018  3:27 PM  Vitals shown include unvalidated device data.  Last Pain:  Vitals:   09/07/18 1149  TempSrc:   PainSc: 4          Complications: No apparent anesthesia complications

## 2018-09-07 NOTE — Op Note (Signed)
09/07/2018  3:17 PM  PATIENT:  Jamie Mahoney DOB: 1928-11-12 MRN: [id number]  PREOP DIAGNOSIS:   RIGHT BREAST PAPILLOMA  POSTOP DIAGNOSIS:    Right breast papilloma, 9 o'clock position, at the edge of areola  PROCEDURE:   Procedure(s):  RIGHT BREAST LUMPECTOMY WITH RADIOACTIVE SEED LOCALIZATION  SURGEON:   Alphonsa Overall, M.D.  ANESTHESIA:   general  Anesthesiologist: Murvin Natal, MD CRNA: Gaylene Brooks, CRNA; Cleda Daub, CRNA  General  EBL:  minimal  ml  DRAINS:  none   LOCAL MEDICATIONS USED:   30 cc of 1/4% marcaine  SPECIMEN:   Left breast mass (painted 6 colors)  COUNTS CORRECT:  YES  INDICATIONS FOR PROCEDURE:  Jamie Mahoney is a 84 y.o. (DOB: 06-18-1929) white female whose primary care physician is [place], Christiane Ha, MD and comes for left breast lumpectomy.   The options for breast cancer treatment have been discussed with the patient. She elected to proceed with lumpectomy and axillary sentinel lymph node.     She had a nipple discharge and was found to have a right subareolar mass.  A biopsy on 08/13/2018 showed a papillary lesion with atypia.  The indications and potential complications of surgery were explained to the patient. Potential complications include, but are not limited to, bleeding, infection, the need for further surgery, and nerve injury.     She had a I131 seed placed on 08/05/2018 in her right breast at [facility].  The seed is in the 9 o'clock position at the edge of the areola in the right breast.     OPERATIVE NOTE:   The patient was taken to operating room # 2 at [facility] where she underwent a general anesthesia  supervised by Anesthesiologist: Murvin Natal, MD CRNA: Gaylene Brooks, CRNA; Cleda Daub, CRNA. Her right breast was prepped with  ChloraPrep and sterilely draped.    A time-out and the surgical check list was reviewed.    I made a lateral circumareolar incision in the right brast to get  to the lesion which was about at the 9 o'clock  position of the right breast.   I used the Neoprobe to identify the I131 seed.  I tried to excise an area around the tumor of at least 1 cm.    I excised this block of breast tissue approximately 3 cm by 3 cm  in diameter.   I painted the lumpectomy specimen with the 6 color paint kit and did a specimen mammogram which confirmed the mass, clip, and the seed were all in the right position in the specimen.  The specimen was sent to pathology who called back to confirm that they have the seed and the specimen.   I then irrigated the wound with saline. I infiltrated approximately 30 mL of 1/4% Marcaine between the incisions.  I then closed all the wounds in layers using 3-0 Vicryl sutures for the deep layer. At the skin, I closed the incisions with a 4-0 Monocryl suture. The incisions were then painted with Dermabond.  She had gauze place over the wounds and placed in a breast binder.   The patient tolerated the procedure well, was transported to the recovery room in good condition. Sponge and needle count were correct at the end of the case.   Final pathology is pending.   Alphonsa Overall, MD, [facility] Pager: [phone] Office phone:  [phone]

## 2018-09-07 NOTE — Discharge Instructions (Signed)
[facility] - DISCHARGE INSTRUCTIONS TO PATIENT  Activity:  Driving - May drive in 1 to 3 days, depending on how you do   Lifting - No lifting more than 15 pounds for one week  Wound Care:   Leave the incision dry for 48 hours, then you may remove the bandage and shower.  Diet:  As tolerated.  Follow up appointment:  Call Dr. Pollie Friar office [facility]) at [phone] for an appointment in 2 to 3 weeks.  Medications and dosages:  Resume your home medications.  You have a prescription for:  vicodin  Call Dr. Lucia Gaskins or his office  [phone]) if you have:  Temperature greater than 100.4,  Persistent nausea and vomiting,  Severe uncontrolled pain,  Redness, tenderness, or signs of infection (pain, swelling, redness, odor or green/yellow discharge around the site),  Difficulty breathing, headache or visual disturbances,  Any other questions or concerns you may have after discharge.  In an emergency, call 911 or go to an Emergency Department at a nearby hospital.

## 2018-09-07 NOTE — Interval H&P Note (Signed)
History and Physical Interval Note:  09/07/2018 1:32 PM  Jamie Mahoney  has presented today for surgery, with the diagnosis of RIGHT BREAST PAPILLOMA  The various methods of treatment have been discussed with the patient and family.  Daughter from [place] in the room.  Seed in location.  After consideration of risks, benefits and other options for treatment, the patient has consented to  Procedure(s): RIGHT BREAST LUMPECTOMY WITH RADIOACTIVE SEED LOCALIZATION (Right) as a surgical intervention .  The patient's history has been reviewed, patient examined, no change in status, stable for surgery.  I have reviewed the patient's chart and labs.  Questions were answered to the patient's satisfaction.     Shann Medal

## 2018-09-07 NOTE — Anesthesia Preprocedure Evaluation (Addendum)
Anesthesia Evaluation  Patient identified by MRN, date of birth, ID band Patient awake    Reviewed: Allergy & Precautions, NPO status , Patient's Chart, lab work & pertinent test results  Airway Mallampati: III  TM Distance: >3 FB Neck ROM: Full    Dental no notable dental hx.    Pulmonary neg pulmonary ROS,    Pulmonary exam normal breath sounds clear to auscultation       Cardiovascular hypertension, Pt. on medications Normal cardiovascular exam Rhythm:Regular Rate:Normal  ECG: rate 72. Sinus rhythm with occasional Premature ventricular complexes. Left anterior fascicular block   Neuro/Psych negative neurological ROS  negative psych ROS   GI/Hepatic negative GI ROS, Neg liver ROS,   Endo/Other  Hypothyroidism   Renal/GU negative Renal ROS     Musculoskeletal  (+) Fibromyalgia -  Abdominal (+) + obese,   Peds  Hematology negative hematology ROS (+)   Anesthesia Other Findings RIGHT BREAST PAPILLOMA  Reproductive/Obstetrics                            Anesthesia Physical Anesthesia Plan  ASA: II  Anesthesia Plan: General   Post-op Pain Management:    Induction: Intravenous  PONV Risk Score and Plan: 3 and Ondansetron, Dexamethasone and Treatment may vary due to age or medical condition  Airway Management Planned: LMA  Additional Equipment:   Intra-op Plan:   Post-operative Plan: Extubation in OR  Informed Consent: I have reviewed the patients History and Physical, chart, labs and discussed the procedure including the risks, benefits and alternatives for the proposed anesthesia with the patient or authorized representative who has indicated his/her understanding and acceptance.   Dental advisory given  Plan Discussed with: CRNA  Anesthesia Plan Comments:         Anesthesia Quick Evaluation

## 2018-09-09 ENCOUNTER — Encounter (HOSPITAL_COMMUNITY): Payer: Self-pay | Admitting: Surgery

## 2018-09-09 NOTE — Anesthesia Postprocedure Evaluation (Signed)
Anesthesia Post Note  Patient: Jamie Mahoney  Procedure(s) Performed: RIGHT BREAST LUMPECTOMY WITH RADIOACTIVE SEED LOCALIZATION (Right Breast)     Patient location during evaluation: PACU Anesthesia Type: General Level of consciousness: awake and alert Pain management: pain level controlled Vital Signs Assessment: post-procedure vital signs reviewed and stable Respiratory status: spontaneous breathing, nonlabored ventilation, respiratory function stable and patient connected to nasal cannula oxygen Cardiovascular status: blood pressure returned to baseline and stable Postop Assessment: no apparent nausea or vomiting Anesthetic complications: no    Last Vitals:  Vitals:   09/07/18 1659 09/07/18 1700  BP:    Pulse: 67 66  Resp: 13 14  Temp:    SpO2: 94% 91%    Last Pain:  Vitals:   09/07/18 1640  TempSrc:   PainSc: 0-No pain                 Jean Alejos P Deandrae Wajda

## 2018-10-03 ENCOUNTER — Encounter: Payer: Self-pay | Admitting: Hematology and Oncology

## 2018-10-03 ENCOUNTER — Telehealth: Payer: Self-pay | Admitting: Hematology and Oncology

## 2018-10-03 NOTE — Telephone Encounter (Signed)
New referral received from Dr. Alphonsa Overall for a new dx of breast cancer. Pt has been scheduled to see Dr. Lindi Adie on 12/31 at 245pm. Pt aware to arrive 30 minutes early. Letter mailed.

## 2018-10-10 DIAGNOSIS — E7800 Pure hypercholesterolemia, unspecified: Secondary | ICD-10-CM | POA: Diagnosis not present

## 2018-10-10 DIAGNOSIS — Z79899 Other long term (current) drug therapy: Secondary | ICD-10-CM | POA: Diagnosis not present

## 2018-10-10 DIAGNOSIS — Z0000 Encounter for general adult medical examination without abnormal findings: Secondary | ICD-10-CM | POA: Diagnosis not present

## 2018-10-10 DIAGNOSIS — Z1389 Encounter for screening for other disorder: Secondary | ICD-10-CM | POA: Diagnosis not present

## 2018-10-10 DIAGNOSIS — M25562 Pain in left knee: Secondary | ICD-10-CM | POA: Diagnosis not present

## 2018-10-10 DIAGNOSIS — E039 Hypothyroidism, unspecified: Secondary | ICD-10-CM | POA: Diagnosis not present

## 2018-10-10 DIAGNOSIS — I10 Essential (primary) hypertension: Secondary | ICD-10-CM | POA: Diagnosis not present

## 2018-10-11 DIAGNOSIS — M1712 Unilateral primary osteoarthritis, left knee: Secondary | ICD-10-CM | POA: Diagnosis not present

## 2018-10-11 DIAGNOSIS — M7581 Other shoulder lesions, right shoulder: Secondary | ICD-10-CM | POA: Diagnosis not present

## 2018-10-11 NOTE — Progress Notes (Signed)
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 - MARGINS OF RESECTION ARE NOT INVOLVED (CLOSEST MARGIN: 1 MM, ANTERIOR/MEDIAL). - BIOPSY SITE CHANGES. - SEE ONCOLOGY TABLE. 1 of Receptor Status: ER(95%), PR (95%)  Did patient present with symptoms or was this found on screening mammography?: She noted a clear right nipple discharge and brought it to the attention of her PCP.   Past/Anticipated interventions by surgeon, if any: 09/07/18 PROCEDURE:   Procedure(s):  RIGHT BREAST LUMPECTOMY WITH RADIOACTIVE SEED LOCALIZATION SURGEON:   Alphonsa Overall, M.D.  Past/Anticipated interventions by medical oncology, if any:  10/23/18 Dr. Lindi Adie   Lymphedema issues, if any:   She denies. She has good arm mobility.   Pain issues, if any:  She denies.   SAFETY ISSUES:  Prior radiation? No  Pacemaker/ICD? No  Possible current pregnancy? No  Is the patient on methotrexate? No  Current Complaints / other details:    BP (!) 154/68 (BP Location: Left Arm)   Pulse 63   Temp 97.6 F (36.4 C) (Oral)   Ht 5\' 2"  (1.575 m)   Wt 164 lb (74.4 kg)   SpO2 97% Comment: room air  BMI 30.00 kg/m    Wt Readings from Last 3 Encounters:  10/19/18 164 lb (74.4 kg)  08/31/18 168 lb 1.6 oz (76.2 kg)  06/02/16 162 lb (73.5 kg)      Jamie Mahoney, Jamie Police, RN 10/11/2018,3:03 PM

## 2018-10-11 NOTE — Progress Notes (Signed)
[facility] CONSULT NOTE  Patient Care Team: Jamie Manes, MD as PCP - General (Internal Medicine)  CHIEF COMPLAINTS/PURPOSE OF CONSULTATION:  Newly diagnosed breast cancer  HISTORY OF PRESENTING ILLNESS:  Jamie Mahoney 84 y.o. female is here because of recent diagnosis of DCIS of the right breast. The cancer was detected on a diagnostic mammogram on 08/06/18 after the patient presented for discharge from the right nipple. A breast biopsy on 08/13/18 revealed the mass at the 9 o' she position to be a papillary lesion with atypia. On 09/07/18 she had a lumpectomy of the right breast for which pathology noted the 1.2cm tumor was grade 3, ER 95%, PR 95%. She was referred by Dr. Alphonsa Mahoney.   She presents to the clinic today with her daughter to discuss further treatment options. She has a history of hysterectomy. She noted her husband has Alzheimer's and she recently lost her grandson and expressed concern about this family stress effecting treatment. Her husband had cancer and had a stroke from medication he was taking, so she has anxiety about taking a pill.    I reviewed her records extensively and collaborated the history with the patient.  SUMMARY OF ONCOLOGIC HISTORY:   Ductal carcinoma in situ (DCIS) of right breast   08/13/2018 Initial Diagnosis    Patient presented with nipple discharge and retroareolar right breast had dilated duct and an intraductal mass 1.4 x 0.4 x 0.5 cm.  Ultrasound biopsy revealed ductal carcinoma in situ (DCIS) of right breast    09/07/2018 Surgery    Right lumpectomy: Grade 3 DCIS arising in a papillary lesion, margins negative, ER 95% no PR 95%, Tis NX stage 0      MEDICAL HISTORY:  Past Medical History:  Diagnosis Date  . Arthritis    OA  . Fibromyalgia   . Hypothyroidism   . Seasonal allergies     SURGICAL HISTORY: Past Surgical History:  Procedure Laterality Date  . ABDOMINAL HYSTERECTOMY    . BREAST LUMPECTOMY  WITH RADIOACTIVE SEED LOCALIZATION Right 09/07/2018   Procedure: RIGHT BREAST LUMPECTOMY WITH RADIOACTIVE SEED LOCALIZATION;  Surgeon: Jamie Overall, MD;  Location: [facility];  Service: General;  Laterality: Right;  . EYE SURGERY     cataract  . MASS EXCISION Left 06/02/2016   Procedure: EXCISION MUCOID CYST DEBRIDEMENT DISTAL INTERPHALANGEAL LEFT INDEX FINGER;  Surgeon: Jamie Brod, MD;  Location: [facility];  Service: Orthopedics;  Laterality: Left;    SOCIAL HISTORY: Social History   Socioeconomic History  . Marital status: Married    Spouse name: Jamie Mahoney  . Number of children: Not on file  . Years of education: Not on file  . Highest education level: Not on file  Occupational History  . Not on file  Social Needs  . Financial resource strain: Not on file  . Food insecurity:    Worry: Not on file    Inability: Not on file  . Transportation needs:    Medical: No    Non-medical: No  Tobacco Use  . Smoking status: Never Smoker  . Smokeless tobacco: Never Used  Substance and Sexual Activity  . Alcohol use: Yes    Comment: occas  . Drug use: No  . Sexual activity: Not on file  Lifestyle  . Physical activity:    Days per week: Not on file    Minutes per session: Not on file  . Stress: Not on file  Relationships  . Social connections:  Talks on phone: Not on file    Gets together: Not on file    Attends religious service: Not on file    Active member of club or organization: Not on file    Attends meetings of clubs or organizations: Not on file    Relationship status: Not on file  . Intimate partner violence:    Fear of current or ex partner: No    Emotionally abused: No    Physically abused: No    Forced sexual activity: No  Other Topics Concern  . Not on file  Social History Narrative  . Not on file    FAMILY HISTORY: Family History  Problem Relation Age of Onset  . Cancer Son   . Colon cancer Son   . Alzheimer's disease Mother   . Heart attack  Father     ALLERGIES:  is allergic to ivp dye [iodinated diagnostic agents]; shellfish allergy; and prednisone.  MEDICATIONS:  Current Outpatient Medications  Medication Sig Dispense Refill  . acetaminophen (TYLENOL) 325 MG tablet Take 650 mg by mouth every 6 (six) hours as needed for moderate pain.    Marland Kitchen amLODipine (NORVASC) 5 MG tablet Take 5 mg by mouth daily.    . diclofenac sodium (VOLTAREN) 1 % GEL Apply 2 g topically 2 (two) times daily.    Marland Kitchen gabapentin (NEURONTIN) 300 MG capsule Take 300 mg by mouth at bedtime.     Marland Kitchen HYDROcodone-acetaminophen (NORCO/VICODIN) 5-325 MG tablet Take 1 tablet by mouth every 6 (six) hours as needed for moderate pain. (Patient not taking: Reported on 10/19/2018) 12 tablet 0  . levothyroxine (SYNTHROID, LEVOTHROID) 50 MCG tablet Take 50 mcg by mouth daily before breakfast.    . Triamcinolone Acetonide (NASACORT ALLERGY 24HR NA) Place 2 sprays into both nostrils daily.     No current facility-administered medications for this visit.     REVIEW OF SYSTEMS:   Constitutional: Denies fevers, chills or abnormal night sweats Eyes: Denies blurriness of vision, double vision or watery eyes Ears, nose, mouth, throat, and face: Denies mucositis or sore throat Respiratory: Denies cough, dyspnea or wheezes Cardiovascular: Denies palpitation, chest discomfort or lower extremity swelling Gastrointestinal:  Denies nausea, heartburn or change in bowel habits Skin: Denies abnormal skin rashes Lymphatics: Denies new lymphadenopathy or easy bruising Neurological:Denies numbness, tingling or new weaknesses Behavioral/Psych: Mood is stable, no new changes  Breast: Denies any palpable lumps or discharge All other systems were reviewed with the patient and are negative.  PHYSICAL EXAMINATION: ECOG PERFORMANCE STATUS: 1 - Symptomatic but completely ambulatory  Vitals:   10/23/18 1427  BP: (!) 143/66  Pulse: 75  Resp: 18  Temp: 97.9 F (36.6 C)  SpO2: 97%   Filed  Weights   10/23/18 1427  Weight: 165 lb 9.6 oz (75.1 kg)    GENERAL:alert, no distress and comfortable SKIN: skin color, texture, turgor are normal, no rashes or significant lesions EYES: normal, conjunctiva are pink and non-injected, sclera clear OROPHARYNX:no exudate, no erythema and lips, buccal mucosa, and tongue normal  NECK: supple, thyroid normal size, non-tender, without nodularity LYMPH:  no palpable lymphadenopathy in the cervical, axillary or inguinal LUNGS: clear to auscultation and percussion with normal breathing effort HEART: regular rate & rhythm and no murmurs and no lower extremity edema ABDOMEN:abdomen soft, non-tender and normal bowel sounds Musculoskeletal:no cyanosis of digits and no clubbing  PSYCH: alert & oriented x 3 with fluent speech NEURO: no focal motor/sensory deficits BREAST: No palpable nodules in breast. No palpable  axillary or supraclavicular lymphadenopathy (exam performed in the presence of a chaperone)   LABORATORY DATA:  I have reviewed the data as listed Lab Results  Component Value Date   WBC 7.5 08/31/2018   HGB 13.9 08/31/2018   HCT 43.2 08/31/2018   MCV 97.3 08/31/2018   PLT 218 08/31/2018   Lab Results  Component Value Date   NA 145 08/31/2018   K 3.9 08/31/2018   CL 113 (H) 08/31/2018   CO2 27 08/31/2018    RADIOGRAPHIC STUDIES: I have personally reviewed the radiological reports and agreed with the findings in the report.  ASSESSMENT AND PLAN:  Ductal carcinoma in situ (DCIS) of right breast 08/13/2018: Right breast nipple discharge, ultrasound 1.4 cm retroareolar intraductal mass 09/07/2018: Right lumpectomy: Grade 3 DCIS arising in a papillary lesion, margins negative, ER 95% no PR 95%, Tis NX stage 0 Pathology review: I discussed with the patient the difference between DCIS and invasive breast cancer. It is considered a precancerous lesion. DCIS is classified as a 0. It is generally detected through mammograms as  calcifications. We discussed the significance of grades and its impact on prognosis. We also discussed the importance of ER and PR receptors and their implications to adjuvant treatment options. Prognosis of DCIS dependence on grade, comedo necrosis. It is anticipated that if not treated, 20-30% of DCIS can develop into invasive breast cancer.  Recommendation: Patient is getting adjuvant radiation therapy. Given her advanced age, we discussed the pros and cons of adjuvant antiestrogen therapy. I discussed with her that she needs either radiation or the antiestrogen pill and not both.  Tamoxifen counseling: We discussed the risks and benefits of tamoxifen. These include but not limited to insomnia, hot flashes, mood changes, vaginal dryness, and weight gain. Although rare, serious side effects including endometrial cancer, risk of blood clots were also discussed. We strongly believe that the benefits far outweigh the risks. Patient understands these risks and consented to starting treatment. Planned treatment duration is 5 years.   Patient will call us and inform us of her decision.  If she decides to take tamoxifen we will start her on 10 mg daily of tamoxifen and then titrated as she tolerates it.  Her husband recently was admitted to an inpatient Alzheimer's unit.  And her grand child recently passed away.  She is in a lot of stress from either of those incidents.  I instructed her that if she wants to take a few months of before she starts tamoxifen that would be acceptable as well.  If she does start tamoxifen I need to see her back in 3 months to assess toxicities.  All questions were answered. The patient knows to call the clinic with any problems, questions or concerns.   Nicholas Lose, MD 10/23/2018    I, Cloyde Reams Dorshimer, am acting as scribe for Nicholas Lose, MD.  I have reviewed the above documentation for accuracy and completeness, and I agree with the above.

## 2018-10-19 ENCOUNTER — Encounter: Payer: Self-pay | Admitting: Radiation Oncology

## 2018-10-19 ENCOUNTER — Ambulatory Visit
Admission: RE | Admit: 2018-10-19 | Discharge: 2018-10-19 | Disposition: A | Discharge status: Home / Self Care | Payer: Medicare Other | Source: Ambulatory Visit | Attending: Radiation Oncology | Admitting: Radiation Oncology

## 2018-10-19 ENCOUNTER — Other Ambulatory Visit: Payer: Self-pay

## 2018-10-19 VITALS — BP 154/68 | HR 63 | Temp 97.6°F | Ht 62.0 in | Wt 164.0 lb

## 2018-10-19 DIAGNOSIS — Z170 Estrogen receptor positive status [ER+]: Secondary | ICD-10-CM | POA: Insufficient documentation

## 2018-10-19 DIAGNOSIS — E039 Hypothyroidism, unspecified: Secondary | ICD-10-CM | POA: Diagnosis not present

## 2018-10-19 DIAGNOSIS — C50811 Malignant neoplasm of overlapping sites of right female breast: Secondary | ICD-10-CM

## 2018-10-19 DIAGNOSIS — Z809 Family history of malignant neoplasm, unspecified: Secondary | ICD-10-CM | POA: Diagnosis not present

## 2018-10-19 DIAGNOSIS — M797 Fibromyalgia: Secondary | ICD-10-CM | POA: Diagnosis not present

## 2018-10-19 DIAGNOSIS — M1712 Unilateral primary osteoarthritis, left knee: Secondary | ICD-10-CM | POA: Insufficient documentation

## 2018-10-19 DIAGNOSIS — Z79899 Other long term (current) drug therapy: Secondary | ICD-10-CM | POA: Diagnosis not present

## 2018-10-19 DIAGNOSIS — D0511 Intraductal carcinoma in situ of right breast: Secondary | ICD-10-CM | POA: Diagnosis not present

## 2018-10-19 DIAGNOSIS — Z9889 Other specified postprocedural states: Secondary | ICD-10-CM | POA: Diagnosis not present

## 2018-10-19 HISTORY — DX: Unspecified osteoarthritis, unspecified site: M19.90

## 2018-10-19 NOTE — Progress Notes (Signed)
Radiation Oncology         [phone] ________________________________  Initial outpatient Consultation  Name: Jamie Mahoney MRN: [id number]  Date: 10/19/2018  DOB: Jan 07, 1929  CH:ENIDPOEUM, Christiane Ha, MD  Alphonsa Overall, MD   REFERRING PHYSICIAN: Alphonsa Overall, MD  DIAGNOSIS:    ICD-10-CM   1. Ductal carcinoma in situ (DCIS) of right breast D05.11    Stage 0 Right Breast DCIS, ER+ / PR+, Grade 3  CHIEF COMPLAINT: Here to discuss management of right breast DCIS  HISTORY OF PRESENT ILLNESS::Jamie Mahoney is a 84 y.o. female who is accompanied by her oldest daughter today. She presented with spontaneous yellowish right nipple discharge to her PCP.  Ultrasound of breast on 08/06/18 revealed small intraductal mass in the retroareolar right breast at 9 o'clock.   Biopsy on date of 08/13/18 showed papillary lesion with atypia worrisome for DCIS.  Right lumpectomy performed on 09/07/18 under Dr. Lucia Gaskins showed DCIS arising in a papillary lesion (1.2 cm) with negative margins.  ER status: 95%, strong; PR status 95%, strong; Grade 3.  She notes recently putting her husband in a care facility, and that her dog went with him. Her daughter states they have the anniversary of her son passing away coming up in January. This has caused emotional distress over the past year.   Other than arthritis to her left knee, she denies any other symptoms.   PREVIOUS RADIATION THERAPY: No  PAST MEDICAL HISTORY:  has a past medical history of Arthritis, Fibromyalgia, Hypothyroidism, and Seasonal allergies.    PAST SURGICAL HISTORY: Past Surgical History:  Procedure Laterality Date  . ABDOMINAL HYSTERECTOMY    . BREAST LUMPECTOMY WITH RADIOACTIVE SEED LOCALIZATION Right 09/07/2018   Procedure: RIGHT BREAST LUMPECTOMY WITH RADIOACTIVE SEED LOCALIZATION;  Surgeon: Alphonsa Overall, MD;  Location: [facility];  Service: General;  Laterality: Right;  . EYE SURGERY     cataract  . MASS EXCISION Left  06/02/2016   Procedure: EXCISION MUCOID CYST DEBRIDEMENT DISTAL INTERPHALANGEAL LEFT INDEX FINGER;  Surgeon: Daryll Brod, MD;  Location: [facility];  Service: Orthopedics;  Laterality: Left;    FAMILY HISTORY: family history includes Alzheimer's disease in her mother; Cancer in her son; Colon cancer in her son; Heart attack in her father.  SOCIAL HISTORY:  reports that she has never smoked. She has never used smokeless tobacco. She reports current alcohol use. She reports that she does not use drugs.  ALLERGIES: Ivp dye [iodinated diagnostic agents]; Shellfish allergy; and Prednisone  MEDICATIONS:  Current Outpatient Medications  Medication Sig Dispense Refill  . acetaminophen (TYLENOL) 325 MG tablet Take 650 mg by mouth every 6 (six) hours as needed for moderate pain.    Marland Kitchen amLODipine (NORVASC) 5 MG tablet Take 5 mg by mouth daily.    . diclofenac sodium (VOLTAREN) 1 % GEL Apply 2 g topically 2 (two) times daily.    Marland Kitchen gabapentin (NEURONTIN) 300 MG capsule Take 300 mg by mouth at bedtime.     Marland Kitchen levothyroxine (SYNTHROID, LEVOTHROID) 50 MCG tablet Take 50 mcg by mouth daily before breakfast.    . Triamcinolone Acetonide (NASACORT ALLERGY 24HR NA) Place 2 sprays into both nostrils daily.    Marland Kitchen HYDROcodone-acetaminophen (NORCO/VICODIN) 5-325 MG tablet Take 1 tablet by mouth every 6 (six) hours as needed for moderate pain. (Patient not taking: Reported on 10/19/2018) 12 tablet 0   No current facility-administered medications for this encounter.     REVIEW OF SYSTEMS: A 10+ POINT REVIEW OF SYSTEMS  WAS OBTAINED including neurology, dermatology, psychiatry, cardiac, respiratory, lymph, extremities, GI, GU, Musculoskeletal, constitutional, breasts, reproductive, HEENT.  All pertinent positives are noted in the HPI.  All others are negative.   PHYSICAL EXAM:  height is 5\' 2"  (1.575 m) and weight is 164 lb (74.4 kg). Her oral temperature is 97.6 F (36.4 C). Her blood pressure is 154/68  (abnormal) and her pulse is 63. Her oxygen saturation is 97%.   General: Alert and oriented, in no acute distress HEENT: Head is normocephalic. Extraocular movements are intact. Oropharynx is clear. Neck: Neck is supple, no palpable cervical or supraclavicular lymphadenopathy. Heart: Regular in rate and rhythm with no murmurs, rubs, or gallops. Chest: Clear to auscultation bilaterally, with no rhonchi, wheezes, or rales. Abdomen: Soft, nontender, nondistended, with no rigidity or guarding. Extremities: No cyanosis. Left lower extremity edema, but no ankle edema. Lymphatics: see Neck Exam Skin: No concerning lesions. Musculoskeletal: symmetric strength and muscle tone throughout. Good range of motion in her shoulders. Neurologic: Cranial nerves II through XII are grossly intact. No obvious focalities. Speech is fluent. Coordination is intact. Psychiatric: Judgment and insight are intact. Affect is appropriate. Breasts: Lumpectomy scar on the right central breast that borders the areola laterally, healing well. No other palpable masses appreciated in the breasts or axillae.    ECOG = 0  0 - Asymptomatic (Fully active, able to carry on all predisease activities without restriction)  1 - Symptomatic but completely ambulatory (Restricted in physically strenuous activity but ambulatory and able to carry out work of a light or sedentary nature. For example, light housework, office work)  2 - Symptomatic, <50% in bed during the day (Ambulatory and capable of all self care but unable to carry out any work activities. Up and about more than 50% of waking hours)  3 - Symptomatic, >50% in bed, but not bedbound (Capable of only limited self-care, confined to bed or chair 50% or more of waking hours)  4 - Bedbound (Completely disabled. Cannot carry on any self-care. Totally confined to bed or chair)  5 - Death   Eustace Pen MM, Creech RH, Tormey DC, et al. [phone]). "Toxicity and response criteria of the  [facility]". [place] Oncol. 5 (6): 649-55   LABORATORY DATA:  Lab Results  Component Value Date   WBC 7.5 08/31/2018   HGB 13.9 08/31/2018   HCT 43.2 08/31/2018   MCV 97.3 08/31/2018   PLT 218 08/31/2018   CMP     Component Value Date/Time   NA 145 08/31/2018 1412   K 3.9 08/31/2018 1412   CL 113 (H) 08/31/2018 1412   CO2 27 08/31/2018 1412   GLUCOSE 102 (H) 08/31/2018 1412   BUN 14 08/31/2018 1412   CREATININE 1.16 (H) 08/31/2018 1412   CALCIUM 8.5 (L) 08/31/2018 1412   GFRNONAA 41 (L) 08/31/2018 1412   GFRAA 48 (L) 08/31/2018 1412     RADIOGRAPHY:  As above     IMPRESSION/PLAN: Right breast DCIS   It was a pleasure meeting the patient today. We discussed the risks, benefits, and side effects of radiotherapy. I recommend radiotherapy to the right breast to reduce her risk of locoregional recurrence by half.  We discussed that radiation would take approximately 4 weeks to complete. We spoke about acute effects including skin irritation and fatigue as well as much less common late effects including internal organ injury or irritation. We spoke about the latest technology that is used to minimize the risk of late  effects for patients undergoing radiotherapy to the breast or chest wall. No guarantees of treatment were given. The patient is enthusiastic about proceeding with treatment. I look forward to participating in the patient's care.  Consent signed today.  I did speak with her about the alternative of an antiestrogen pill.  She will see med/onc next week for consultation about this. We'll tentatively schedule her for RT planning in January.  If she opts to take the pill instead of RT, she knows to call us to cancel the simulation.  I spent 50 minutes  face to face with the patient and more than 50% of that time was spent in counseling and/or coordination of care.   __________________________________________   Eppie Gibson, MD   This document  serves as a record of services personally performed by Eppie Gibson, MD. It was created on her behalf by Wilburn Mylar, a trained medical scribe. The creation of this record is based on the scribe's personal observations and the provider's statements to them. This document has been checked and approved by the attending provider.

## 2018-10-23 ENCOUNTER — Inpatient Hospital Stay: Payer: Medicare Other | Attending: Hematology and Oncology | Admitting: Hematology and Oncology

## 2018-10-23 DIAGNOSIS — Z800 Family history of malignant neoplasm of digestive organs: Secondary | ICD-10-CM

## 2018-10-23 DIAGNOSIS — F419 Anxiety disorder, unspecified: Secondary | ICD-10-CM

## 2018-10-23 DIAGNOSIS — Z170 Estrogen receptor positive status [ER+]: Secondary | ICD-10-CM | POA: Diagnosis not present

## 2018-10-23 DIAGNOSIS — D0511 Intraductal carcinoma in situ of right breast: Secondary | ICD-10-CM

## 2018-10-23 DIAGNOSIS — Z809 Family history of malignant neoplasm, unspecified: Secondary | ICD-10-CM

## 2018-10-23 DIAGNOSIS — Z90710 Acquired absence of both cervix and uterus: Secondary | ICD-10-CM | POA: Diagnosis not present

## 2018-10-23 NOTE — Assessment & Plan Note (Signed)
08/13/2018: Right breast nipple discharge, ultrasound 1.4 cm retroareolar intraductal mass 09/07/2018: Right lumpectomy: Grade 3 DCIS arising in a papillary lesion, margins negative, ER 95% no PR 95%, Tis NX stage 0 Pathology review: I discussed with the patient the difference between DCIS and invasive breast cancer. It is considered a precancerous lesion. DCIS is classified as a 0. It is generally detected through mammograms as calcifications. We discussed the significance of grades and its impact on prognosis. We also discussed the importance of ER and PR receptors and their implications to adjuvant treatment options. Prognosis of DCIS dependence on grade, comedo necrosis. It is anticipated that if not treated, 20-30% of DCIS can develop into invasive breast cancer.  Recommendation: Patient is getting adjuvant radiation therapy. Given her advanced age, we discussed the pros and cons of adjuvant antiestrogen therapy. I discussed with her that she needs either radiation or the antiestrogen pill and not both.  Tamoxifen counseling: We discussed the risks and benefits of tamoxifen. These include but not limited to insomnia, hot flashes, mood changes, vaginal dryness, and weight gain. Although rare, serious side effects including endometrial cancer, risk of blood clots were also discussed. We strongly believe that the benefits far outweigh the risks. Patient understands these risks and consented to starting treatment. Planned treatment duration is 5 years.

## 2018-10-25 ENCOUNTER — Telehealth: Payer: Self-pay | Admitting: Hematology and Oncology

## 2018-10-25 NOTE — Telephone Encounter (Signed)
Per 12/31 no los °

## 2018-10-25 NOTE — Telephone Encounter (Signed)
per

## 2018-10-29 ENCOUNTER — Ambulatory Visit
Admission: RE | Admit: 2018-10-29 | Discharge: 2018-10-29 | Disposition: A | Discharge status: Home / Self Care | Payer: Medicare Other | Source: Ambulatory Visit | Attending: Radiation Oncology | Admitting: Radiation Oncology

## 2018-10-29 DIAGNOSIS — D0511 Intraductal carcinoma in situ of right breast: Secondary | ICD-10-CM | POA: Diagnosis not present

## 2018-10-29 DIAGNOSIS — Z510 Encounter for antineoplastic radiation therapy: Secondary | ICD-10-CM | POA: Diagnosis not present

## 2018-10-29 NOTE — Progress Notes (Signed)
  Radiation Oncology         [phone]) [phone] ________________________________  Name: Jamie Mahoney MRN: [id number]  Date: 10/29/2018  DOB: Nov 22, 1928  SIMULATION AND TREATMENT PLANNING NOTE    Outpatient  DIAGNOSIS:     ICD-10-CM   1. Ductal carcinoma in situ (DCIS) of right breast D05.11     NARRATIVE:  The patient was brought to the [facility].  Identity was confirmed.  All relevant records and images related to the planned course of therapy were reviewed.  The patient freely provided informed written consent to proceed with treatment after reviewing the details related to the planned course of therapy. The consent form was witnessed and verified by the simulation staff.    Then, the patient was set-up in a stable reproducible supine position for radiation therapy with her ipsilateral arm over her head, and her upper body secured in a custom-made Vac-lok device.  CT images were obtained.  Surface markings were placed.  The CT images were loaded into the planning software.    TREATMENT PLANNING NOTE: Treatment planning then occurred.  The radiation prescription was entered and confirmed.     A total of 3 medically necessary complex treatment devices were fabricated and supervised by me: 2 fields with MLCs for custom blocks to protect heart, and lungs;  and, a Vac-lok. MORE COMPLEX DEVICES MAY BE MADE IN DOSIMETRY FOR FIELD IN FIELD BEAMS FOR DOSE HOMOGENEITY.  I have requested : 3D Simulation which is medically necessary to give adequate dose to at risk tissues while sparing lungs and heart.  I have requested a DVH of the following structures: lungs, heart, right lumpectomy cavity.    The patient will receive 40.05 Gy in 15 fractions to the right breast with 2 tangential fields.  This will be followed by a boost.  Optical Surface Tracking Plan:  Since intensity modulated radiotherapy (IMRT) and 3D conformal radiation treatment methods are predicated on accurate and  precise positioning for treatment, intrafraction motion monitoring is medically necessary to ensure accurate and safe treatment delivery. The ability to quantify intrafraction motion without excessive ionizing radiation dose can only be performed with optical surface tracking. Accordingly, surface imaging offers the opportunity to obtain 3D measurements of patient position throughout IMRT and 3D treatments without excessive radiation exposure. I am ordering optical surface tracking for this patient's upcoming course of radiotherapy.  ________________________________   Reference:  Ursula Alert, J, et al. Surface imaging-based analysis of intrafraction motion for breast radiotherapy patients.Journal of [place], n. 6, nov. 2014. ISSN [id number].  Available at: <http://www.jacmp.org/index.php/jacmp/article/view/4957>.    -----------------------------------  Eppie Gibson, MD

## 2018-10-30 DIAGNOSIS — H1013 Acute atopic conjunctivitis, bilateral: Secondary | ICD-10-CM | POA: Diagnosis not present

## 2018-11-02 ENCOUNTER — Other Ambulatory Visit: Payer: Self-pay

## 2018-11-02 ENCOUNTER — Telehealth: Payer: Self-pay | Admitting: General Practice

## 2018-11-02 DIAGNOSIS — D0511 Intraductal carcinoma in situ of right breast: Secondary | ICD-10-CM

## 2018-11-02 NOTE — Telephone Encounter (Signed)
[facility] CSW Progress Notes  Request received from [facility] RN to reach out to patient to offer support.  Patient repeorts feeling "overwhelmed" - new diagnosis of cancer, husband has Alzheimers and may be on hospice care, grandson died per record.  Patient had requested medications to help w overwhelming emotions - MD aware.  Called and left VM for patient - will also alert CSW [facility] and ask her to reach out next week.  Edwyna Shell, LCSW Clinical Social Worker Phone:  [phone]

## 2018-11-05 ENCOUNTER — Ambulatory Visit: Payer: Medicare Other | Admitting: Radiation Oncology

## 2018-11-05 DIAGNOSIS — Z510 Encounter for antineoplastic radiation therapy: Secondary | ICD-10-CM | POA: Diagnosis not present

## 2018-11-05 DIAGNOSIS — D0511 Intraductal carcinoma in situ of right breast: Secondary | ICD-10-CM | POA: Diagnosis not present

## 2018-11-06 ENCOUNTER — Ambulatory Visit: Payer: Medicare Other

## 2018-11-06 ENCOUNTER — Ambulatory Visit
Admission: RE | Admit: 2018-11-06 | Discharge: 2018-11-06 | Disposition: A | Discharge status: Home / Self Care | Payer: Medicare Other | Source: Ambulatory Visit | Attending: Radiation Oncology | Admitting: Radiation Oncology

## 2018-11-06 DIAGNOSIS — Z510 Encounter for antineoplastic radiation therapy: Secondary | ICD-10-CM | POA: Diagnosis not present

## 2018-11-06 DIAGNOSIS — D0511 Intraductal carcinoma in situ of right breast: Secondary | ICD-10-CM | POA: Diagnosis not present

## 2018-11-07 ENCOUNTER — Ambulatory Visit
Admission: RE | Admit: 2018-11-07 | Discharge: 2018-11-07 | Disposition: A | Discharge status: Home / Self Care | Payer: Medicare Other | Source: Ambulatory Visit | Attending: Radiation Oncology | Admitting: Radiation Oncology

## 2018-11-07 DIAGNOSIS — Z510 Encounter for antineoplastic radiation therapy: Secondary | ICD-10-CM | POA: Diagnosis not present

## 2018-11-07 DIAGNOSIS — D0511 Intraductal carcinoma in situ of right breast: Secondary | ICD-10-CM | POA: Diagnosis not present

## 2018-11-08 ENCOUNTER — Ambulatory Visit
Admission: RE | Admit: 2018-11-08 | Discharge: 2018-11-08 | Disposition: A | Discharge status: Home / Self Care | Payer: Medicare Other | Source: Ambulatory Visit | Attending: Radiation Oncology | Admitting: Radiation Oncology

## 2018-11-08 DIAGNOSIS — D0511 Intraductal carcinoma in situ of right breast: Secondary | ICD-10-CM

## 2018-11-08 DIAGNOSIS — Z510 Encounter for antineoplastic radiation therapy: Secondary | ICD-10-CM | POA: Diagnosis not present

## 2018-11-08 MED ORDER — RADIAPLEXRX EX GEL
Freq: Once | CUTANEOUS | Status: AC
  Administered 2018-11-08: 15:00:00 via TOPICAL

## 2018-11-08 MED ORDER — ALRA NON-METALLIC DEODORANT (RAD-ONC)
1.0000 "application " | Freq: Once | TOPICAL | Status: AC
  Administered 2018-11-08: 1 via TOPICAL

## 2018-11-08 NOTE — Progress Notes (Signed)

## 2018-11-09 ENCOUNTER — Ambulatory Visit
Admission: RE | Admit: 2018-11-09 | Discharge: 2018-11-09 | Disposition: A | Discharge status: Home / Self Care | Payer: Medicare Other | Source: Ambulatory Visit | Attending: Radiation Oncology | Admitting: Radiation Oncology

## 2018-11-09 DIAGNOSIS — Z510 Encounter for antineoplastic radiation therapy: Secondary | ICD-10-CM | POA: Diagnosis not present

## 2018-11-09 DIAGNOSIS — D0511 Intraductal carcinoma in situ of right breast: Secondary | ICD-10-CM | POA: Diagnosis not present

## 2018-11-12 ENCOUNTER — Ambulatory Visit
Admission: RE | Admit: 2018-11-12 | Discharge: 2018-11-12 | Disposition: A | Discharge status: Home / Self Care | Payer: Medicare Other | Source: Ambulatory Visit | Attending: Radiation Oncology | Admitting: Radiation Oncology

## 2018-11-12 DIAGNOSIS — Z510 Encounter for antineoplastic radiation therapy: Secondary | ICD-10-CM | POA: Diagnosis not present

## 2018-11-12 DIAGNOSIS — D0511 Intraductal carcinoma in situ of right breast: Secondary | ICD-10-CM | POA: Diagnosis not present

## 2018-11-13 ENCOUNTER — Ambulatory Visit
Admission: RE | Admit: 2018-11-13 | Discharge: 2018-11-13 | Disposition: A | Discharge status: Home / Self Care | Payer: Medicare Other | Source: Ambulatory Visit | Attending: Radiation Oncology | Admitting: Radiation Oncology

## 2018-11-13 DIAGNOSIS — D0511 Intraductal carcinoma in situ of right breast: Secondary | ICD-10-CM | POA: Diagnosis not present

## 2018-11-13 DIAGNOSIS — Z510 Encounter for antineoplastic radiation therapy: Secondary | ICD-10-CM | POA: Diagnosis not present

## 2018-11-14 ENCOUNTER — Ambulatory Visit
Admission: RE | Admit: 2018-11-14 | Discharge: 2018-11-14 | Disposition: A | Discharge status: Home / Self Care | Payer: Medicare Other | Source: Ambulatory Visit | Attending: Radiation Oncology | Admitting: Radiation Oncology

## 2018-11-14 DIAGNOSIS — D0511 Intraductal carcinoma in situ of right breast: Secondary | ICD-10-CM | POA: Diagnosis not present

## 2018-11-14 DIAGNOSIS — Z510 Encounter for antineoplastic radiation therapy: Secondary | ICD-10-CM | POA: Diagnosis not present

## 2018-11-15 ENCOUNTER — Ambulatory Visit
Admission: RE | Admit: 2018-11-15 | Discharge: 2018-11-15 | Disposition: A | Discharge status: Home / Self Care | Payer: Medicare Other | Source: Ambulatory Visit | Attending: Radiation Oncology | Admitting: Radiation Oncology

## 2018-11-15 DIAGNOSIS — D0511 Intraductal carcinoma in situ of right breast: Secondary | ICD-10-CM | POA: Diagnosis not present

## 2018-11-15 DIAGNOSIS — Z510 Encounter for antineoplastic radiation therapy: Secondary | ICD-10-CM | POA: Diagnosis not present

## 2018-11-16 ENCOUNTER — Ambulatory Visit
Admission: RE | Admit: 2018-11-16 | Discharge: 2018-11-16 | Disposition: A | Discharge status: Home / Self Care | Payer: Medicare Other | Source: Ambulatory Visit | Attending: Radiation Oncology | Admitting: Radiation Oncology

## 2018-11-16 DIAGNOSIS — Z510 Encounter for antineoplastic radiation therapy: Secondary | ICD-10-CM | POA: Diagnosis not present

## 2018-11-16 DIAGNOSIS — D0511 Intraductal carcinoma in situ of right breast: Secondary | ICD-10-CM | POA: Diagnosis not present

## 2018-11-19 ENCOUNTER — Ambulatory Visit
Admission: RE | Admit: 2018-11-19 | Discharge: 2018-11-19 | Disposition: A | Discharge status: Home / Self Care | Payer: Medicare Other | Source: Ambulatory Visit | Attending: Radiation Oncology | Admitting: Radiation Oncology

## 2018-11-19 DIAGNOSIS — D0511 Intraductal carcinoma in situ of right breast: Secondary | ICD-10-CM | POA: Diagnosis not present

## 2018-11-19 DIAGNOSIS — Z510 Encounter for antineoplastic radiation therapy: Secondary | ICD-10-CM | POA: Diagnosis not present

## 2018-11-20 ENCOUNTER — Ambulatory Visit
Admission: RE | Admit: 2018-11-20 | Discharge: 2018-11-20 | Disposition: A | Discharge status: Home / Self Care | Payer: Medicare Other | Source: Ambulatory Visit | Attending: Radiation Oncology | Admitting: Radiation Oncology

## 2018-11-20 DIAGNOSIS — Z510 Encounter for antineoplastic radiation therapy: Secondary | ICD-10-CM | POA: Diagnosis not present

## 2018-11-20 DIAGNOSIS — D0511 Intraductal carcinoma in situ of right breast: Secondary | ICD-10-CM | POA: Diagnosis not present

## 2018-11-21 ENCOUNTER — Ambulatory Visit
Admission: RE | Admit: 2018-11-21 | Discharge: 2018-11-21 | Disposition: A | Discharge status: Home / Self Care | Payer: Medicare Other | Source: Ambulatory Visit | Attending: Radiation Oncology | Admitting: Radiation Oncology

## 2018-11-21 DIAGNOSIS — D0511 Intraductal carcinoma in situ of right breast: Secondary | ICD-10-CM | POA: Diagnosis not present

## 2018-11-21 DIAGNOSIS — Z510 Encounter for antineoplastic radiation therapy: Secondary | ICD-10-CM | POA: Diagnosis not present

## 2018-11-22 ENCOUNTER — Ambulatory Visit
Admission: RE | Admit: 2018-11-22 | Discharge: 2018-11-22 | Disposition: A | Discharge status: Home / Self Care | Payer: Medicare Other | Source: Ambulatory Visit | Attending: Radiation Oncology | Admitting: Radiation Oncology

## 2018-11-22 DIAGNOSIS — D0511 Intraductal carcinoma in situ of right breast: Secondary | ICD-10-CM | POA: Diagnosis not present

## 2018-11-22 DIAGNOSIS — Z510 Encounter for antineoplastic radiation therapy: Secondary | ICD-10-CM | POA: Diagnosis not present

## 2018-11-23 ENCOUNTER — Ambulatory Visit
Admission: RE | Admit: 2018-11-23 | Discharge: 2018-11-23 | Disposition: A | Discharge status: Home / Self Care | Payer: Medicare Other | Source: Ambulatory Visit | Attending: Radiation Oncology | Admitting: Radiation Oncology

## 2018-11-23 DIAGNOSIS — D0511 Intraductal carcinoma in situ of right breast: Secondary | ICD-10-CM | POA: Diagnosis not present

## 2018-11-23 DIAGNOSIS — Z510 Encounter for antineoplastic radiation therapy: Secondary | ICD-10-CM | POA: Diagnosis not present

## 2018-11-26 ENCOUNTER — Ambulatory Visit
Admission: RE | Admit: 2018-11-26 | Discharge: 2018-11-26 | Disposition: A | Discharge status: Home / Self Care | Payer: Medicare Other | Source: Ambulatory Visit | Attending: Radiation Oncology | Admitting: Radiation Oncology

## 2018-11-26 ENCOUNTER — Ambulatory Visit: Payer: Medicare Other

## 2018-11-26 DIAGNOSIS — D0511 Intraductal carcinoma in situ of right breast: Secondary | ICD-10-CM | POA: Diagnosis not present

## 2018-11-26 DIAGNOSIS — Z510 Encounter for antineoplastic radiation therapy: Secondary | ICD-10-CM | POA: Diagnosis not present

## 2018-11-27 ENCOUNTER — Ambulatory Visit
Admission: RE | Admit: 2018-11-27 | Discharge: 2018-11-27 | Disposition: A | Discharge status: Home / Self Care | Payer: Medicare Other | Source: Ambulatory Visit | Attending: Radiation Oncology | Admitting: Radiation Oncology

## 2018-11-27 ENCOUNTER — Ambulatory Visit: Payer: Medicare Other

## 2018-11-27 DIAGNOSIS — D0511 Intraductal carcinoma in situ of right breast: Secondary | ICD-10-CM | POA: Diagnosis not present

## 2018-11-27 DIAGNOSIS — Z510 Encounter for antineoplastic radiation therapy: Secondary | ICD-10-CM | POA: Diagnosis not present

## 2018-11-28 ENCOUNTER — Ambulatory Visit
Admission: RE | Admit: 2018-11-28 | Discharge: 2018-11-28 | Disposition: A | Discharge status: Home / Self Care | Payer: Medicare Other | Source: Ambulatory Visit | Attending: Radiation Oncology | Admitting: Radiation Oncology

## 2018-11-28 DIAGNOSIS — Z510 Encounter for antineoplastic radiation therapy: Secondary | ICD-10-CM | POA: Diagnosis not present

## 2018-11-28 DIAGNOSIS — D0511 Intraductal carcinoma in situ of right breast: Secondary | ICD-10-CM | POA: Diagnosis not present

## 2018-11-29 ENCOUNTER — Ambulatory Visit
Admission: RE | Admit: 2018-11-29 | Discharge: 2018-11-29 | Disposition: A | Discharge status: Home / Self Care | Payer: Medicare Other | Source: Ambulatory Visit | Attending: Radiation Oncology | Admitting: Radiation Oncology

## 2018-11-29 DIAGNOSIS — D0511 Intraductal carcinoma in situ of right breast: Secondary | ICD-10-CM | POA: Diagnosis not present

## 2018-11-29 DIAGNOSIS — Z510 Encounter for antineoplastic radiation therapy: Secondary | ICD-10-CM | POA: Diagnosis not present

## 2018-11-30 ENCOUNTER — Ambulatory Visit
Admission: RE | Admit: 2018-11-30 | Discharge: 2018-11-30 | Disposition: A | Discharge status: Home / Self Care | Payer: Medicare Other | Source: Ambulatory Visit | Attending: Radiation Oncology | Admitting: Radiation Oncology

## 2018-11-30 ENCOUNTER — Ambulatory Visit: Payer: Medicare Other

## 2018-11-30 DIAGNOSIS — D0511 Intraductal carcinoma in situ of right breast: Secondary | ICD-10-CM | POA: Diagnosis not present

## 2018-11-30 DIAGNOSIS — Z510 Encounter for antineoplastic radiation therapy: Secondary | ICD-10-CM | POA: Diagnosis not present

## 2018-12-03 ENCOUNTER — Ambulatory Visit
Admission: RE | Admit: 2018-12-03 | Discharge: 2018-12-03 | Disposition: A | Discharge status: Home / Self Care | Payer: Medicare Other | Source: Ambulatory Visit | Attending: Radiation Oncology | Admitting: Radiation Oncology

## 2018-12-03 ENCOUNTER — Encounter: Payer: Self-pay | Admitting: Radiation Oncology

## 2018-12-03 DIAGNOSIS — D0511 Intraductal carcinoma in situ of right breast: Secondary | ICD-10-CM

## 2018-12-03 DIAGNOSIS — Z510 Encounter for antineoplastic radiation therapy: Secondary | ICD-10-CM | POA: Diagnosis not present

## 2018-12-03 MED ORDER — RADIAPLEXRX EX GEL
Freq: Once | CUTANEOUS | Status: AC
  Administered 2018-12-03: 11:00:00 via TOPICAL

## 2018-12-11 NOTE — Progress Notes (Signed)
  Radiation Oncology         [phone]) [phone] ________________________________  Name: Jamie Mahoney MRN: [id number]  Date: 12/03/2018  DOB: Apr 29, 1929  End of Treatment Note  Diagnosis:   85 y.o. female with Stage 0 Right Breast DCIS, ER+ / PR+, Grade 3    Indication for treatment:  Curative       Radiation treatment dates:   11/06/2018 - 12/03/2018  Site/dose:    1. Right Breast / 40.05 Gy in 15 fractions 2. Right Breast Boost / 10 Gy in 5 fractions  Beams/energy:    1. 3D / 6X, 10X Photon 2. 12E electron boost  Narrative: The patient tolerated radiation treatment relatively well.  She did experience some expected skin irritation as she progressed through treatment. There is erythema over her right breast and dermatitis in the UIQ, but the skin has remained intact. She is using Radiaplex as directed and hydrocortisone cream for itching. She also noted moderate fatigue.  Plan: The patient has completed radiation treatment. The patient will return to radiation oncology clinic for routine followup in one month. I advised them to call or return sooner if they have any questions or concerns related to their recovery or treatment.  -----------------------------------  Eppie Gibson, MD  This document serves as a record of services personally performed by Eppie Gibson, MD. It was created on her behalf by Rae Lips, a trained medical scribe. The creation of this record is based on the scribe's personal observations and the provider's statements to them. This document has been checked and approved by the attending provider.

## 2018-12-26 ENCOUNTER — Encounter: Payer: Self-pay | Admitting: Radiation Oncology

## 2018-12-26 NOTE — Progress Notes (Signed)
Jamie Mahoney presents for follow up of radiation completed 12/03/18 to her Right Breast. She last saw Dr. Lindi Adie on 10/23/18. Her skin has healed well from radiation. She has hyperpigmentation present. She continues to use radiaplex as directed. She will switch to a vitamin E containing lotion when the radiaplex is completed.   BP (!) 130/59 (BP Location: Left Arm)   Pulse 70   Temp 98.1 F (36.7 C) (Oral)   Resp 20   Ht 5\' 2"  (1.575 m)   Wt 168 lb 12.8 oz (76.6 kg)   SpO2 96%   BMI 30.87 kg/m    Wt Readings from Last 3 Encounters:  01/01/19 168 lb 12.8 oz (76.6 kg)  10/23/18 165 lb 9.6 oz (75.1 kg)  10/19/18 164 lb (74.4 kg)

## 2019-01-01 ENCOUNTER — Ambulatory Visit
Admission: RE | Admit: 2019-01-01 | Discharge: 2019-01-01 | Disposition: A | Discharge status: Home / Self Care | Payer: Medicare Other | Source: Ambulatory Visit | Attending: Radiation Oncology | Admitting: Radiation Oncology

## 2019-01-01 ENCOUNTER — Other Ambulatory Visit: Payer: Self-pay

## 2019-01-01 ENCOUNTER — Encounter: Payer: Self-pay | Admitting: Radiation Oncology

## 2019-01-01 DIAGNOSIS — Z923 Personal history of irradiation: Secondary | ICD-10-CM | POA: Diagnosis not present

## 2019-01-01 DIAGNOSIS — D0511 Intraductal carcinoma in situ of right breast: Secondary | ICD-10-CM

## 2019-01-01 DIAGNOSIS — R634 Abnormal weight loss: Secondary | ICD-10-CM | POA: Insufficient documentation

## 2019-01-01 DIAGNOSIS — Z79899 Other long term (current) drug therapy: Secondary | ICD-10-CM | POA: Diagnosis not present

## 2019-01-01 HISTORY — DX: Personal history of irradiation: Z92.3

## 2019-01-01 NOTE — Progress Notes (Signed)
Radiation Oncology         [phone]) [phone] ________________________________  Name: Jamie Mahoney MRN: [id number]  Date: 01/01/2019  DOB: 02/03/1929  Follow-Up Visit Note  Outpatient  CC: Lajean Manes, MD  Alphonsa Overall, MD  Diagnosis and Prior Radiotherapy:    ICD-10-CM   1. Ductal carcinoma in situ (DCIS) of right breast D05.11    11/06/2018 - 12/03/2018: Right Breast (+boost) / 40.05 Gy in 15 fractions (+10 Gy in 5 fractions)  CHIEF COMPLAINT: Here for follow-up and surveillance of right breast DCIS  Narrative:  The patient returns today for routine follow-up after completion of radiation therapy to the right breast on 12/03/2018. She last saw Dr. Lindi Adie on 10/23/2018. She decided not to take tamoxifen and is not scheduled to follow up with Dr. Lindi Adie. Her skin has healed well, hyperpigmentation present. She states a noticeable difference in her breast size since the swelling in her right breast has gone down, which bothers her depending on her outfit.    ALLERGIES:  is allergic to ivp dye [iodinated diagnostic agents]; shellfish allergy; and prednisone.  Meds: Current Outpatient Medications  Medication Sig Dispense Refill  . acetaminophen (TYLENOL) 325 MG tablet Take 650 mg by mouth every 6 (six) hours as needed for moderate pain.    Marland Kitchen amLODipine (NORVASC) 5 MG tablet Take 5 mg by mouth daily.    . diclofenac sodium (VOLTAREN) 1 % GEL Apply 2 g topically 2 (two) times daily.    Marland Kitchen gabapentin (NEURONTIN) 300 MG capsule Take 300 mg by mouth at bedtime.     Marland Kitchen levothyroxine (SYNTHROID, LEVOTHROID) 50 MCG tablet Take 50 mcg by mouth daily before breakfast.    . Triamcinolone Acetonide (NASACORT ALLERGY 24HR NA) Place 2 sprays into both nostrils daily.    Marland Kitchen HYDROcodone-acetaminophen (NORCO/VICODIN) 5-325 MG tablet Take 1 tablet by mouth every 6 (six) hours as needed for moderate pain. (Patient not taking: Reported on 10/19/2018) 12 tablet 0   No current facility-administered  medications for this encounter.     Physical Findings: The patient is in no acute distress. Patient is alert and oriented.  height is 5\' 2"  (1.575 m) and weight is 168 lb 12.8 oz (76.6 kg). Her oral temperature is 98.1 F (36.7 C). Her blood pressure is 130/59 (abnormal) and her pulse is 70. Her respiration is 20 and oxygen saturation is 96%. .    General: Alert and oriented, in no acute distress Skin: Satisfactory skin healing in radiotherapy fields. A little residual pinkness over the right breast. Skin is intact. Right breast smaller than left. Neurologic: No obvious focalities. Speech is fluent. Coordination is intact. Psychiatric: Judgment and insight are intact. Affect is appropriate.   Lab Findings: Lab Results  Component Value Date   WBC 7.5 08/31/2018   HGB 13.9 08/31/2018   HCT 43.2 08/31/2018   MCV 97.3 08/31/2018   PLT 218 08/31/2018    Radiographic Findings: No results found.  Impression/Plan: Healing well from radiotherapy to the breast tissue.  Continue skin care with topical Vitamin E Oil and / or lotion for at least 2 more months for further healing.  I encouraged her to continue with yearly mammography as appropriate (for intact breast tissue) and followup with her PCP per her preferences. I will see her back on an as-needed basis. I have encouraged her to call if she has any issues or concerns in the future. I wished her the very best.  I wrote a prescription for  bras/ prostheses  and wrote down the address of [place] for her.  She wants to lose weight due to recent gain.  Has knee issues. I gave her information on her local [employer], recommended she try water aerobics.   I spent 20 minutes face to face with the patient and more than 50% of that time was spent in counseling and/or coordination of care. _____________________________________   Eppie Gibson, MD  This document serves as a record of services personally performed by Eppie Gibson, MD. It  was created on her behalf by Wilburn Mylar, a trained medical scribe. The creation of this record is based on the scribe's personal observations and the provider's statements to them. This document has been checked and approved by the attending provider.

## 2019-01-02 ENCOUNTER — Encounter: Payer: Self-pay | Admitting: Radiation Oncology

## 2019-01-02 ENCOUNTER — Other Ambulatory Visit: Payer: Self-pay | Admitting: Radiation Oncology

## 2019-01-02 DIAGNOSIS — D0511 Intraductal carcinoma in situ of right breast: Secondary | ICD-10-CM

## 2019-01-03 ENCOUNTER — Telehealth: Payer: Self-pay | Admitting: Adult Health

## 2019-01-03 NOTE — Telephone Encounter (Signed)
Per 3/11 sch message- pt does not want long term survivorship appt at the moment.

## 2019-04-03 DIAGNOSIS — D0591 Unspecified type of carcinoma in situ of right breast: Secondary | ICD-10-CM | POA: Diagnosis not present

## 2019-04-03 DIAGNOSIS — Z85828 Personal history of other malignant neoplasm of skin: Secondary | ICD-10-CM | POA: Diagnosis not present

## 2019-04-03 DIAGNOSIS — L820 Inflamed seborrheic keratosis: Secondary | ICD-10-CM | POA: Diagnosis not present

## 2019-04-03 DIAGNOSIS — L814 Other melanin hyperpigmentation: Secondary | ICD-10-CM | POA: Diagnosis not present

## 2019-04-03 DIAGNOSIS — D1801 Hemangioma of skin and subcutaneous tissue: Secondary | ICD-10-CM | POA: Diagnosis not present

## 2019-04-03 DIAGNOSIS — L570 Actinic keratosis: Secondary | ICD-10-CM | POA: Diagnosis not present

## 2019-04-03 DIAGNOSIS — L821 Other seborrheic keratosis: Secondary | ICD-10-CM | POA: Diagnosis not present

## 2019-04-11 DIAGNOSIS — I10 Essential (primary) hypertension: Secondary | ICD-10-CM | POA: Diagnosis not present

## 2019-04-11 DIAGNOSIS — M1712 Unilateral primary osteoarthritis, left knee: Secondary | ICD-10-CM | POA: Diagnosis not present

## 2019-04-11 DIAGNOSIS — G500 Trigeminal neuralgia: Secondary | ICD-10-CM | POA: Diagnosis not present

## 2019-04-11 DIAGNOSIS — M79671 Pain in right foot: Secondary | ICD-10-CM | POA: Diagnosis not present

## 2019-04-19 ENCOUNTER — Other Ambulatory Visit: Payer: Self-pay

## 2019-04-19 ENCOUNTER — Ambulatory Visit (INDEPENDENT_AMBULATORY_CARE_PROVIDER_SITE_OTHER): Payer: Medicare Other

## 2019-04-19 ENCOUNTER — Ambulatory Visit (INDEPENDENT_AMBULATORY_CARE_PROVIDER_SITE_OTHER): Payer: Medicare Other | Admitting: Podiatry

## 2019-04-19 ENCOUNTER — Encounter: Payer: Self-pay | Admitting: Podiatry

## 2019-04-19 VITALS — BP 95/69 | HR 73 | Temp 97.3°F | Resp 16

## 2019-04-19 DIAGNOSIS — M722 Plantar fascial fibromatosis: Secondary | ICD-10-CM

## 2019-04-19 DIAGNOSIS — M79671 Pain in right foot: Secondary | ICD-10-CM | POA: Diagnosis not present

## 2019-04-19 DIAGNOSIS — G8929 Other chronic pain: Secondary | ICD-10-CM | POA: Diagnosis not present

## 2019-04-19 DIAGNOSIS — M216X1 Other acquired deformities of right foot: Secondary | ICD-10-CM | POA: Diagnosis not present

## 2019-04-19 MED ORDER — MELOXICAM 7.5 MG PO TABS: 7.5000 mg | ORAL_TABLET | Freq: Every day | ORAL | 0 refills | Status: AC

## 2019-04-19 NOTE — Progress Notes (Signed)
Subjective:   Patient ID: Jamie Mahoney, female   DOB: 85 y.o.   MRN: [id number]   HPI 85 year old female presents the office today for concerns of right heel pain which is been ongoing for about 1 month and is worse in the morning when she first gets up or after being on her feet all day.  She is tried stretching which helps some.  She denies any recent injury or trauma.  No swelling or redness.  No numbness or tingling.  The pain does not wake her up at night.   Review of Systems  All other systems reviewed and are negative.  Past Medical History:  Diagnosis Date  . Arthritis    OA  . Fibromyalgia   . History of radiation therapy 11/06/18- 12/03/18   Right Breast/ 40.05 Gy in 15 fractions, right breast boost/ 10 gy in 5 fractions.   . Hypothyroidism   . Seasonal allergies     Past Surgical History:  Procedure Laterality Date  . ABDOMINAL HYSTERECTOMY    . BREAST LUMPECTOMY WITH RADIOACTIVE SEED LOCALIZATION Right 09/07/2018   Procedure: RIGHT BREAST LUMPECTOMY WITH RADIOACTIVE SEED LOCALIZATION;  Surgeon: Alphonsa Overall, MD;  Location: [facility];  Service: General;  Laterality: Right;  . EYE SURGERY     cataract  . MASS EXCISION Left 06/02/2016   Procedure: EXCISION MUCOID CYST DEBRIDEMENT DISTAL INTERPHALANGEAL LEFT INDEX FINGER;  Surgeon: Daryll Brod, MD;  Location: [facility];  Service: Orthopedics;  Laterality: Left;     Current Outpatient Medications:  .  acetaminophen (TYLENOL) 325 MG tablet, Take 650 mg by mouth every 6 (six) hours as needed for moderate pain., Disp: , Rfl:  .  amLODipine (NORVASC) 5 MG tablet, Take 5 mg by mouth daily., Disp: , Rfl:  .  diclofenac sodium (VOLTAREN) 1 % GEL, Apply 2 g topically 2 (two) times daily., Disp: , Rfl:  .  gabapentin (NEURONTIN) 300 MG capsule, Take 300 mg by mouth at bedtime. , Disp: , Rfl:  .  hydrochlorothiazide (HYDRODIURIL) 12.5 MG tablet, TAKE 1 TABLET BY MOUTH IN THE MORNING FOR 30 DAYS, Disp: , Rfl:   .  levothyroxine (SYNTHROID, LEVOTHROID) 50 MCG tablet, Take 50 mcg by mouth daily before breakfast., Disp: , Rfl:  .  meloxicam (MOBIC) 7.5 MG tablet, Take 1 tablet (7.5 mg total) by mouth daily., Disp: 14 tablet, Rfl: 0  Allergies  Allergen Reactions  . Ivp Dye [Iodinated Diagnostic Agents] Hives  . Shellfish Allergy     Was told never to have any after the iodine reaction  . Prednisone Anxiety          Objective:  Physical Exam  General: AAO x3, NAD  Dermatological: Skin is warm, dry and supple bilateral. Nails x 10 are well manicured; remaining integument appears unremarkable at this time. There are no open sores, no preulcerative lesions, no rash or signs of infection present.  Vascular: Dorsalis Pedis artery and Posterior Tibial artery pedal pulses are 2/4 bilateral with immedate capillary fill time. Pedal hair growth present. No varicosities and no lower extremity edema present bilateral. There is no pain with calf compression, swelling, warmth, erythema.   Neruologic: Grossly intact via light touch bilateral. Vibratory intact via tuning fork bilateral. Protective threshold with Semmes Wienstein monofilament intact to all pedal sites bilateral.  Negative Tinel sign.  Musculoskeletal:Tenderness to palpation along the plantar medial tubercle of the calcaneus at the insertion of plantar fascia on the right foot. There is no pain along  the course of the plantar fascia within the arch of the foot. Plantar fascia appears to be intact. There is no pain with lateral compression of the calcaneus or pain with vibratory sensation. There is no pain along the course or insertion of the achilles tendon. No other areas of tenderness to bilateral lower extremities. Muscular strength 5/5 in all groups tested bilateral.  Gait: Unassisted, Nonantalgic.       Assessment:   Right heel pain, plantar fasciitis     Plan:  -Treatment options discussed including all alternatives, risks, and  complications. -Etiology of symptoms were discussed -X-rays were obtained and reviewed with the patient.  No evidence of acute fracture. -Hold off on steroid injection today. -Prescribed mobic to use as needed. Discussed side effects of the medication and directed to stop if any are to occur and call the office.  -Night splint dispensed -Plantar fascial brace -Discussed stretching, icing daily -Discussed shoe modifications and orthotics  Return in about 4 weeks (around 05/17/2019).  Trula Slade DPM

## 2019-04-19 NOTE — Patient Instructions (Addendum)
Start wearing the night splint at home while awake before sleeping in it.  Wear the other brace over your socks in your shoes during the day. Ice Massage and Cold Packs Home Program  Ice and cold packs are used so that we can return the muscle to it's natural resting state without causing more pain, which can lead to more spasm, etc.  Icing and cold packs are also used to reduce swelling, which can lead to pain and stiffness.  COLD PACKS Cold packs should be placed circumferentially around the swollen area (ie., the wrist, etc.).  A paper towel can be placed over the area before the cold pack is applied.  A towel may be wrapped around the outside of the cold pack to keep the cold in.  The swollen area should be elevated with the cold pack, if possible.  ICE MASSAGE Fill a 4 ounce paper cup three-quarters full and put it in a freezer until it is frozen.  When ready to use, tear off about 1 inch of the cup so that some of the ice is showing while the bottom of the cup can be used to hold onto. Massage the entire muscle area as instructed by your therapist.  You may use circular or up and down strokes, but do not hold the ice in one spot.  Four phases to the ice massage and cold pack application: 1. Cold: which you feel when you first apply the ice. 2. Ache: after a few minutes 3. Burning: after approximately five minutes, it will feel like your skin is burning.  At this point, remove the ice for a minute or so. 4. Numbness: THIS IS THE CRUCIAL PHASE!!! Return the ice or cold pack and massage until all the burning disappears.  This signals the end of cryotherapy.  The entire procedure should take ten to fifteen minutes while using the cold pack.  Do Not perform the ice massage for more than seven minutes on a small area or more than ten minutes on a large area.  Cryotherapy should be performed after exercises, when edema occurs, or when an area is painful.  Meloxicam tablets What is this medicine?  MELOXICAM (mel OX i cam) is a non-steroidal anti-inflammatory drug (NSAID). It is used to reduce swelling and to treat pain. It may be used for osteoarthritis, rheumatoid arthritis, or juvenile rheumatoid arthritis. This medicine may be used for other purposes; ask your health care provider or pharmacist if you have questions. COMMON BRAND NAME(S): Mobic What should I tell my health care provider before I take this medicine? They need to know if you have any of these conditions: -bleeding disorders -cigarette smoker -coronary artery bypass graft (CABG) surgery within the past 2 weeks -drink more than 3 alcohol-containing drinks per day -heart disease -high blood pressure -history of stomach bleeding -kidney disease -liver disease -lung or breathing disease, like asthma -stomach or intestine problems -an unusual or allergic reaction to meloxicam, aspirin, other NSAIDs, other medicines, foods, dyes, or preservatives -pregnant or trying to get pregnant -breast-feeding How should I use this medicine? Take this medicine by mouth with a full glass of water. Follow the directions on the prescription label. You can take it with or without food. If it upsets your stomach, take it with food. Take your medicine at regular intervals. Do not take it more often than directed. Do not stop taking except on your doctor's advice. A special MedGuide will be given to you by the pharmacist with each  prescription and refill. Be sure to read this information carefully each time. Talk to your pediatrician regarding the use of this medicine in children. While this drug may be prescribed for selected conditions, precautions do apply. Patients over 73 years old may have a stronger reaction and need a smaller dose. Overdosage: If you think you have taken too much of this medicine contact a poison control center or emergency room at once. NOTE: This medicine is only for you. Do not share this medicine with others.  What if I miss a dose? If you miss a dose, take it as soon as you can. If it is almost time for your next dose, take only that dose. Do not take double or extra doses. What may interact with this medicine? Do not take this medicine with any of the following medications: -cidofovir -ketorolac This medicine may also interact with the following medications: -aspirin and aspirin-like medicines -certain medicines for blood pressure, heart disease, irregular heart beat -certain medicines for depression, anxiety, or psychotic disturbances -certain medicines that treat or prevent blood clots like warfarin, enoxaparin, dalteparin, apixaban, dabigatran, rivaroxaban -cyclosporine -diuretics -methotrexate -other NSAIDs, medicines for pain and inflammation, like ibuprofen and naproxen -pemetrexed This list may not describe all possible interactions. Give your health care provider a list of all the medicines, herbs, non-prescription drugs, or dietary supplements you use. Also tell them if you smoke, drink alcohol, or use illegal drugs. Some items may interact with your medicine. What should I watch for while using this medicine? Tell your doctor or healthcare professional if your symptoms do not start to get better or if they get worse. Do not take other medicines that contain aspirin, ibuprofen, or naproxen with this medicine. Side effects such as stomach upset, nausea, or ulcers may be more likely to occur. Many medicines available without a prescription should not be taken with this medicine. This medicine can cause ulcers and bleeding in the stomach and intestines at any time during treatment. This can happen with no warning and may cause death. There is increased risk with taking this medicine for a long time. Smoking, drinking alcohol, older age, and poor health can also increase risks. Call your doctor right away if you have stomach pain or blood in your vomit or stool. This medicine does not prevent  heart attack or stroke. In fact, this medicine may increase the chance of a heart attack or stroke. The chance may increase with longer use of this medicine and in people who have heart disease. If you take aspirin to prevent heart attack or stroke, talk with your doctor or health care professional. What side effects may I notice from receiving this medicine? Side effects that you should report to your doctor or health care professional as soon as possible: -allergic reactions like skin rash, itching or hives, swelling of the face, lips, or tongue -nausea, vomiting -signs and symptoms of a blood clot such as breathing problems; changes in vision; chest pain; severe, sudden headache; pain, swelling, warmth in the leg; trouble speaking; sudden numbness or weakness of the face, arm, or leg -signs and symptoms of bleeding such as bloody or black, tarry stools; red or dark-brown urine; spitting up blood or brown material that looks like coffee grounds; red spots on the skin; unusual bruising or bleeding from the eye, gums, or nose -signs and symptoms of liver injury like dark yellow or brown urine; general ill feeling or flu-like symptoms; light-colored stools; loss of appetite; nausea; right upper belly  pain; unusually weak or tired; yellowing of the eyes or skin -signs and symptoms of stroke like changes in vision; confusion; trouble speaking or understanding; severe headaches; sudden numbness or weakness of the face, arm, or leg; trouble walking; dizziness; loss of balance or coordination Side effects that usually do not require medical attention (report to your doctor or health care professional if they continue or are bothersome): -constipation -diarrhea -gas This list may not describe all possible side effects. Call your doctor for medical advice about side effects. You may report side effects to FDA at 1-800-FDA-1088. Where should I keep my medicine? Keep out of the reach of children. Store at room  temperature between 15 and 30 degrees C (59 and 86 degrees F). Throw away any unused medicine after the expiration date. NOTE: This sheet is a summary. It may not cover all possible information. If you have questions about this medicine, talk to your doctor, pharmacist, or health care provider.  2019 Elsevier/Gold Standard (2018-02-09 11:22:56)    Plantar Fasciitis (Heel Spur Syndrome) with Rehab The plantar fascia is a fibrous, ligament-like, soft-tissue structure that spans the bottom of the foot. Plantar fasciitis is a condition that causes pain in the foot due to inflammation of the tissue. SYMPTOMS   Pain and tenderness on the underneath side of the foot.  Pain that worsens with standing or walking. CAUSES  Plantar fasciitis is caused by irritation and injury to the plantar fascia on the underneath side of the foot. Common mechanisms of injury include:  Direct trauma to bottom of the foot.  Damage to a small nerve that runs under the foot where the main fascia attaches to the heel bone.  Stress placed on the plantar fascia due to bone spurs. RISK INCREASES WITH:   Activities that place stress on the plantar fascia (running, jumping, pivoting, or cutting).  Poor strength and flexibility.  Improperly fitted shoes.  Tight calf muscles.  Flat feet.  Failure to warm-up properly before activity.  Obesity. PREVENTION  Warm up and stretch properly before activity.  Allow for adequate recovery between workouts.  Maintain physical fitness:  Strength, flexibility, and endurance.  Cardiovascular fitness.  Maintain a health body weight.  Avoid stress on the plantar fascia.  Wear properly fitted shoes, including arch supports for individuals who have flat feet.  PROGNOSIS  If treated properly, then the symptoms of plantar fasciitis usually resolve without surgery. However, occasionally surgery is necessary.  RELATED COMPLICATIONS   Recurrent symptoms that may  result in a chronic condition.  Problems of the lower back that are caused by compensating for the injury, such as limping.  Pain or weakness of the foot during push-off following surgery.  Chronic inflammation, scarring, and partial or complete fascia tear, occurring more often from repeated injections.  TREATMENT  Treatment initially involves the use of ice and medication to help reduce pain and inflammation. The use of strengthening and stretching exercises may help reduce pain with activity, especially stretches of the Achilles tendon. These exercises may be performed at home or with a therapist. Your caregiver may recommend that you use heel cups of arch supports to help reduce stress on the plantar fascia. Occasionally, corticosteroid injections are given to reduce inflammation. If symptoms persist for greater than 6 months despite non-surgical (conservative), then surgery may be recommended.   MEDICATION   If pain medication is necessary, then nonsteroidal anti-inflammatory medications, such as aspirin and ibuprofen, or other minor pain relievers, such as acetaminophen, are often recommended.  Do not take pain medication within 7 days before surgery.  Prescription pain relievers may be given if deemed necessary by your caregiver. Use only as directed and only as much as you need.  Corticosteroid injections may be given by your caregiver. These injections should be reserved for the most serious cases, because they may only be given a certain number of times.  HEAT AND COLD  Cold treatment (icing) relieves pain and reduces inflammation. Cold treatment should be applied for 10 to 15 minutes every 2 to 3 hours for inflammation and pain and immediately after any activity that aggravates your symptoms. Use ice packs or massage the area with a piece of ice (ice massage).  Heat treatment may be used prior to performing the stretching and strengthening activities prescribed by your caregiver,  physical therapist, or athletic trainer. Use a heat pack or soak the injury in warm water.  SEEK IMMEDIATE MEDICAL CARE IF:  Treatment seems to offer no benefit, or the condition worsens.  Any medications produce adverse side effects.  EXERCISES- RANGE OF MOTION (ROM) AND STRETCHING EXERCISES - Plantar Fasciitis (Heel Spur Syndrome) These exercises may help you when beginning to rehabilitate your injury. Your symptoms may resolve with or without further involvement from your physician, physical therapist or athletic trainer. While completing these exercises, remember:   Restoring tissue flexibility helps normal motion to return to the joints. This allows healthier, less painful movement and activity.  An effective stretch should be held for at least 30 seconds.  A stretch should never be painful. You should only feel a gentle lengthening or release in the stretched tissue.  RANGE OF MOTION - Toe Extension, Flexion  Sit with your right / left leg crossed over your opposite knee.  Grasp your toes and gently pull them back toward the top of your foot. You should feel a stretch on the bottom of your toes and/or foot.  Hold this stretch for 10 seconds.  Now, gently pull your toes toward the bottom of your foot. You should feel a stretch on the top of your toes and or foot.  Hold this stretch for 10 seconds. Repeat  times. Complete this stretch 3 times per day.   RANGE OF MOTION - Ankle Dorsiflexion, Active Assisted  Remove shoes and sit on a chair that is preferably not on a carpeted surface.  Place right / left foot under knee. Extend your opposite leg for support.  Keeping your heel down, slide your right / left foot back toward the chair until you feel a stretch at your ankle or calf. If you do not feel a stretch, slide your bottom forward to the edge of the chair, while still keeping your heel down.  Hold this stretch for 10 seconds. Repeat 3 times. Complete this stretch 2  times per day.   STRETCH  Gastroc, Standing  Place hands on wall.  Extend right / left leg, keeping the front knee somewhat bent.  Slightly point your toes inward on your back foot.  Keeping your right / left heel on the floor and your knee straight, shift your weight toward the wall, not allowing your back to arch.  You should feel a gentle stretch in the right / left calf. Hold this position for 10 seconds. Repeat 3 times. Complete this stretch 2 times per day.  STRETCH  Soleus, Standing  Place hands on wall.  Extend right / left leg, keeping the other knee somewhat bent.  Slightly point your toes inward  on your back foot.  Keep your right / left heel on the floor, bend your back knee, and slightly shift your weight over the back leg so that you feel a gentle stretch deep in your back calf.  Hold this position for 10 seconds. Repeat 3 times. Complete this stretch 2 times per day.  STRETCH  Gastrocsoleus, Standing  Note: This exercise can place a lot of stress on your foot and ankle. Please complete this exercise only if specifically instructed by your caregiver.   Place the ball of your right / left foot on a step, keeping your other foot firmly on the same step.  Hold on to the wall or a rail for balance.  Slowly lift your other foot, allowing your body weight to press your heel down over the edge of the step.  You should feel a stretch in your right / left calf.  Hold this position for 10 seconds.  Repeat this exercise with a slight bend in your right / left knee. Repeat 3 times. Complete this stretch 2 times per day.   STRENGTHENING EXERCISES - Plantar Fasciitis (Heel Spur Syndrome)  These exercises may help you when beginning to rehabilitate your injury. They may resolve your symptoms with or without further involvement from your physician, physical therapist or athletic trainer. While completing these exercises, remember:   Muscles can gain both the endurance and  the strength needed for everyday activities through controlled exercises.  Complete these exercises as instructed by your physician, physical therapist or athletic trainer. Progress the resistance and repetitions only as guided.  STRENGTH - Towel Curls  Sit in a chair positioned on a non-carpeted surface.  Place your foot on a towel, keeping your heel on the floor.  Pull the towel toward your heel by only curling your toes. Keep your heel on the floor. Repeat 3 times. Complete this exercise 2 times per day.  STRENGTH - Ankle Inversion  Secure one end of a rubber exercise band/tubing to a fixed object (table, pole). Loop the other end around your foot just before your toes.  Place your fists between your knees. This will focus your strengthening at your ankle.  Slowly, pull your big toe up and in, making sure the band/tubing is positioned to resist the entire motion.  Hold this position for 10 seconds.  Have your muscles resist the band/tubing as it slowly pulls your foot back to the starting position. Repeat 3 times. Complete this exercises 2 times per day.  Document Released: 10/10/2005 Document Revised: 01/02/2012 Document Reviewed: 01/22/2009 [facility] Patient Information 2014 [place], [place].

## 2019-05-09 DIAGNOSIS — Z79899 Other long term (current) drug therapy: Secondary | ICD-10-CM | POA: Diagnosis not present

## 2019-05-09 DIAGNOSIS — I10 Essential (primary) hypertension: Secondary | ICD-10-CM | POA: Diagnosis not present

## 2019-05-17 ENCOUNTER — Ambulatory Visit: Payer: Medicare Other | Admitting: Podiatry

## 2019-06-26 IMAGING — MG MM PLC BREAST LOC DEV 1ST LESION INC*R*
8 series · 8 of 8 positions shown · non-contrast
Comparison: Previous exam(s).

CLINICAL DATA: Recently diagnosed papillary lesion with atypia of
the RIGHT breast with scheduled surgical excision requiring
preoperative radioactive seed localization.

EXAM:
MAMMOGRAPHIC GUIDED RADIOACTIVE SEED LOCALIZATION OF THE RIGHT
BREAST

[R CC (1 of 5)]
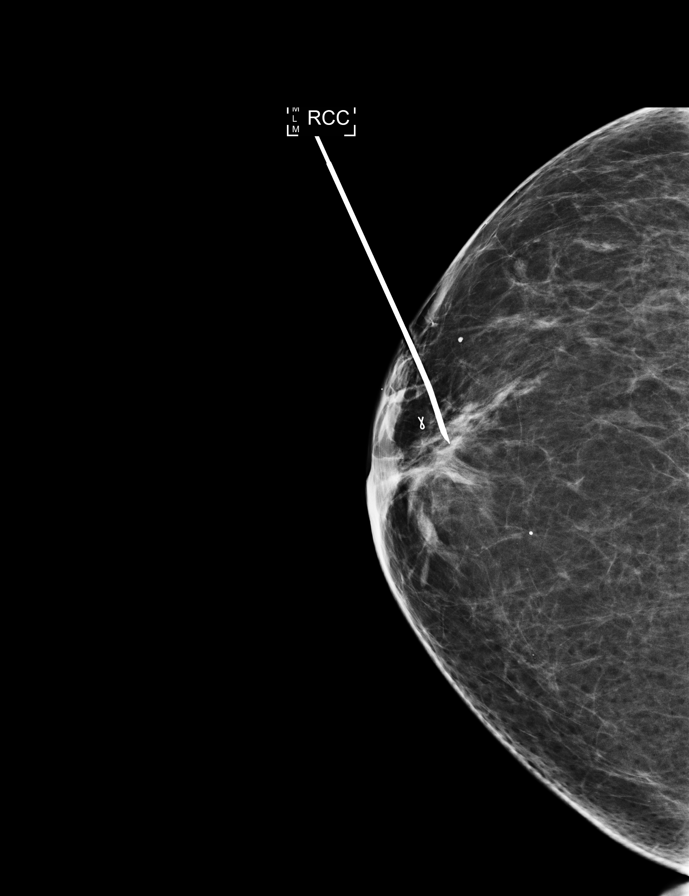

[R LM (1 of 3)]
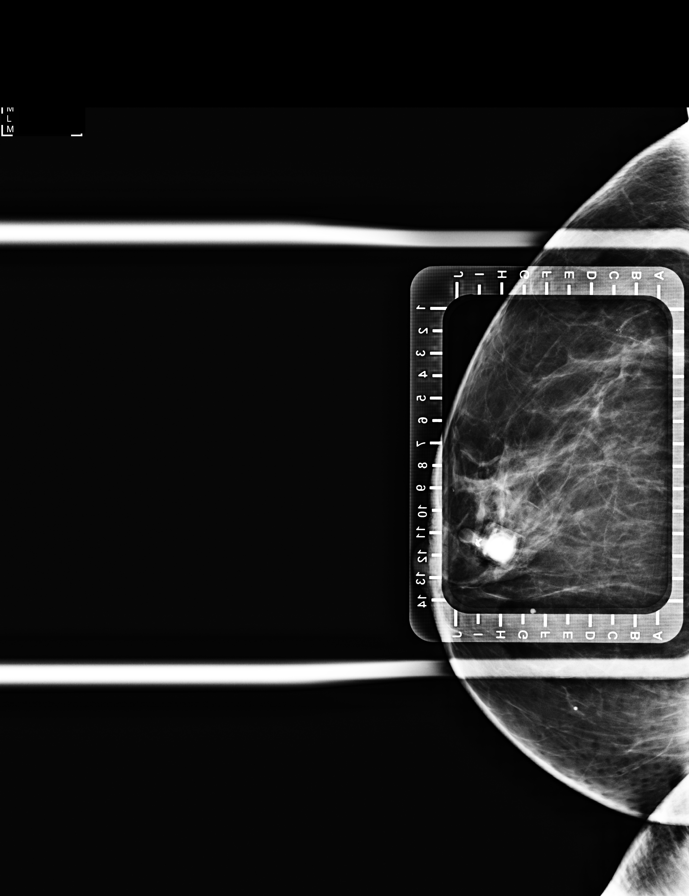

[R LM (2 of 3)]
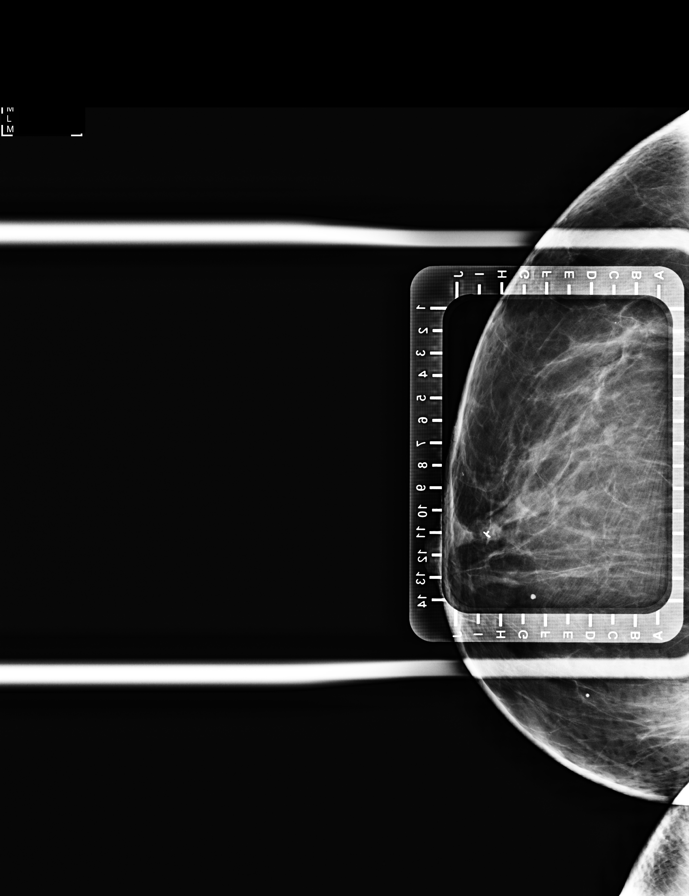

[R LM (3 of 3)]
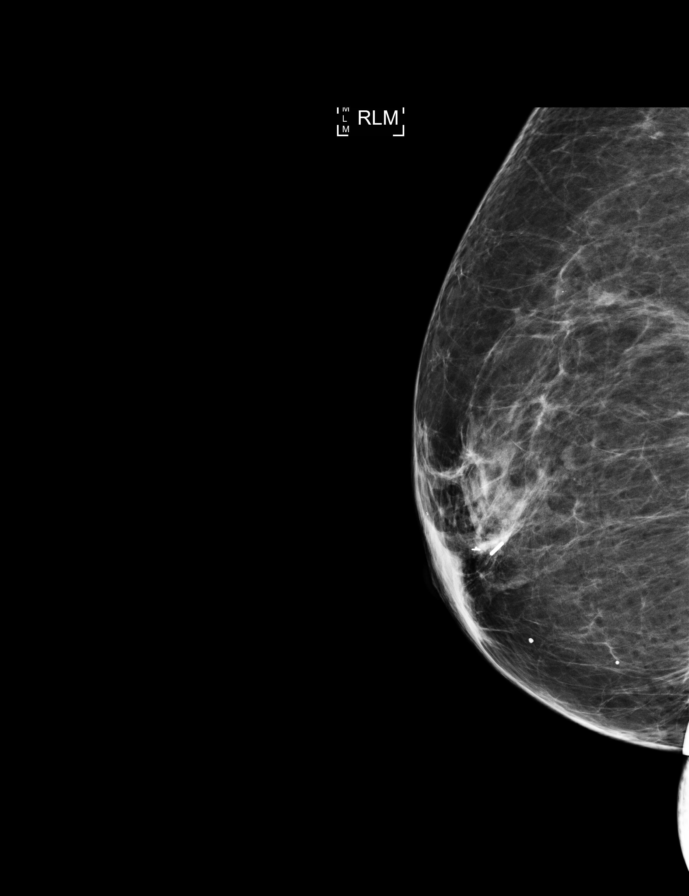

[R CC (2 of 5)]
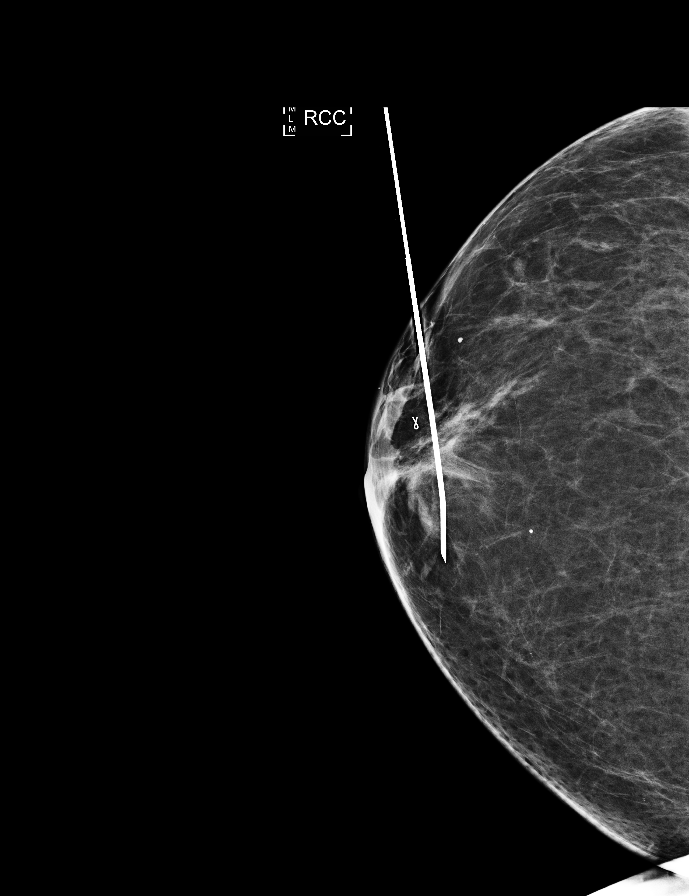

[R CC (3 of 5)]
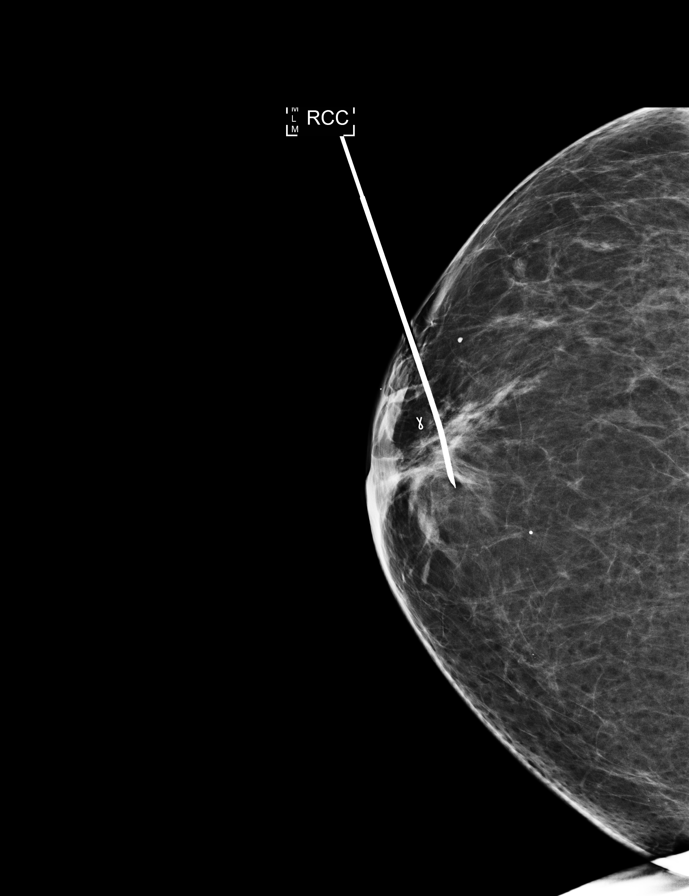

[R CC (4 of 5)]
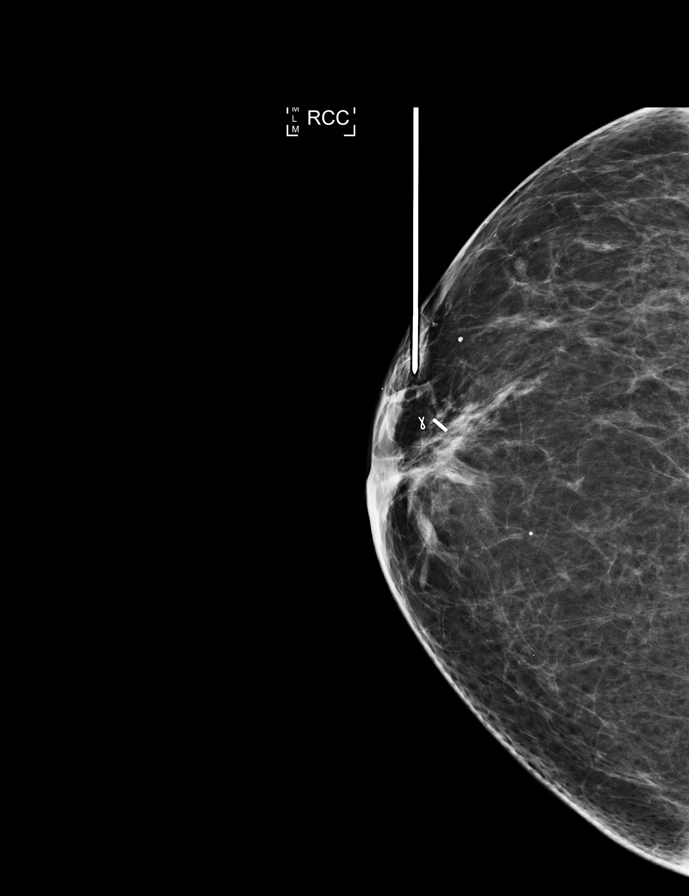

[R CC (5 of 5)]
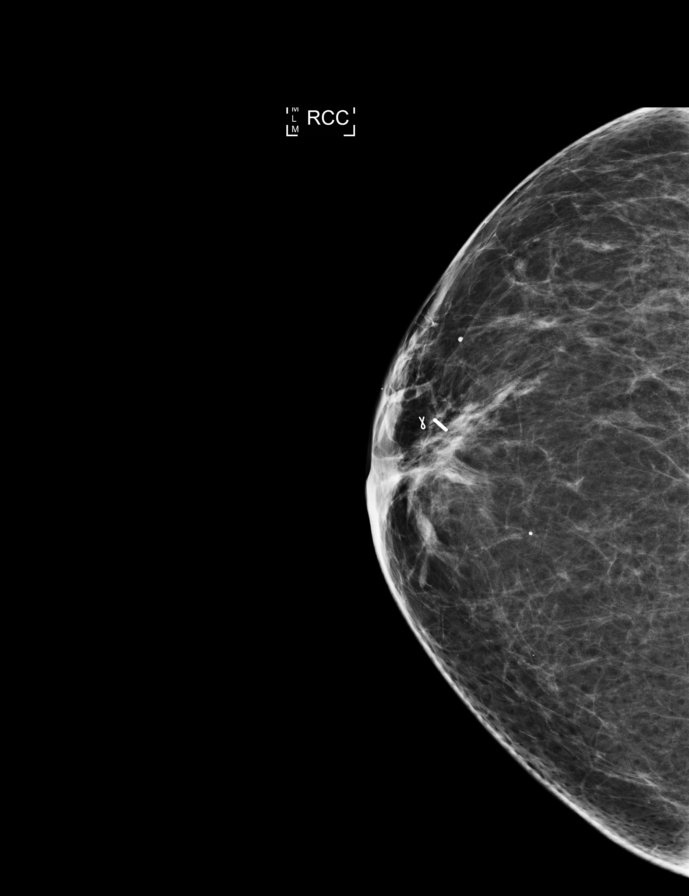

[8 of 8 positions shown; findings below may reference images not displayed]

FINDINGS: Patient presents for radioactive seed localization prior to surgical
excision. I met with the patient and we discussed the procedure of
seed localization including benefits and alternatives. We discussed
the high likelihood of a successful procedure. We discussed the
risks of the procedure including infection, bleeding, tissue injury
and further surgery. We discussed the low dose of radioactivity
involved in the procedure. Informed, written consent was given.

The usual time-out protocol was performed immediately prior to the
procedure.

Using mammographic guidance, sterile technique, 1% lidocaine and an
[id number] radioactive seed, the ribbon shaped clip within the RIGHT
breast was localized using a lateral approach. The follow-up
mammogram images confirm the seed in the expected location and were
marked for Dr. Cano.

Follow-up survey of the patient confirms presence of the radioactive
seed.

Order number of [id number] seed:  [id number].

Total activity:  0.245 millicurie reference Date: 08/29/2018

The patient tolerated the procedure well and was released from the
[REDACTED]. She was given instructions regarding seed removal.
IMPRESSION: Radioactive seed localization right breast. No apparent
complications.

## 2019-07-01 DIAGNOSIS — R079 Chest pain, unspecified: Secondary | ICD-10-CM | POA: Diagnosis not present

## 2019-07-02 DIAGNOSIS — Z7982 Long term (current) use of aspirin: Secondary | ICD-10-CM | POA: Diagnosis not present

## 2019-07-02 DIAGNOSIS — Z91013 Allergy to seafood: Secondary | ICD-10-CM | POA: Diagnosis not present

## 2019-07-02 DIAGNOSIS — I10 Essential (primary) hypertension: Secondary | ICD-10-CM | POA: Diagnosis not present

## 2019-07-02 DIAGNOSIS — Z853 Personal history of malignant neoplasm of breast: Secondary | ICD-10-CM | POA: Diagnosis not present

## 2019-07-02 DIAGNOSIS — Z79899 Other long term (current) drug therapy: Secondary | ICD-10-CM | POA: Diagnosis not present

## 2019-07-02 DIAGNOSIS — R079 Chest pain, unspecified: Secondary | ICD-10-CM | POA: Diagnosis not present

## 2019-07-02 DIAGNOSIS — R0789 Other chest pain: Secondary | ICD-10-CM | POA: Diagnosis not present

## 2019-07-02 DIAGNOSIS — J9811 Atelectasis: Secondary | ICD-10-CM | POA: Diagnosis not present

## 2019-07-02 DIAGNOSIS — R0602 Shortness of breath: Secondary | ICD-10-CM | POA: Diagnosis not present

## 2019-07-02 DIAGNOSIS — R918 Other nonspecific abnormal finding of lung field: Secondary | ICD-10-CM | POA: Diagnosis not present

## 2019-07-02 DIAGNOSIS — I444 Left anterior fascicular block: Secondary | ICD-10-CM | POA: Diagnosis not present

## 2019-07-02 DIAGNOSIS — E876 Hypokalemia: Secondary | ICD-10-CM | POA: Diagnosis not present

## 2019-07-02 DIAGNOSIS — Z888 Allergy status to other drugs, medicaments and biological substances status: Secondary | ICD-10-CM | POA: Diagnosis not present

## 2019-07-02 DIAGNOSIS — E039 Hypothyroidism, unspecified: Secondary | ICD-10-CM | POA: Diagnosis not present

## 2019-07-04 DIAGNOSIS — E663 Overweight: Secondary | ICD-10-CM | POA: Diagnosis not present

## 2019-07-04 DIAGNOSIS — R072 Precordial pain: Secondary | ICD-10-CM | POA: Diagnosis not present

## 2019-08-12 ENCOUNTER — Other Ambulatory Visit: Payer: Self-pay | Admitting: Surgery

## 2019-08-12 DIAGNOSIS — Z9889 Other specified postprocedural states: Secondary | ICD-10-CM

## 2019-08-14 ENCOUNTER — Other Ambulatory Visit: Payer: Self-pay

## 2019-08-14 ENCOUNTER — Ambulatory Visit
Admission: RE | Admit: 2019-08-14 | Discharge: 2019-08-14 | Disposition: A | Discharge status: Home / Self Care | Payer: Medicare Other | Source: Ambulatory Visit | Attending: Surgery | Admitting: Surgery

## 2019-08-14 DIAGNOSIS — Z9889 Other specified postprocedural states: Secondary | ICD-10-CM

## 2019-08-14 DIAGNOSIS — R928 Other abnormal and inconclusive findings on diagnostic imaging of breast: Secondary | ICD-10-CM | POA: Diagnosis not present

## 2019-08-14 DIAGNOSIS — Z853 Personal history of malignant neoplasm of breast: Secondary | ICD-10-CM | POA: Diagnosis not present

## 2019-08-14 HISTORY — DX: Personal history of irradiation: Z92.3

## 2019-08-23 DIAGNOSIS — I10 Essential (primary) hypertension: Secondary | ICD-10-CM | POA: Diagnosis not present

## 2019-09-02 DIAGNOSIS — Z79899 Other long term (current) drug therapy: Secondary | ICD-10-CM | POA: Diagnosis not present

## 2019-09-09 DIAGNOSIS — I10 Essential (primary) hypertension: Secondary | ICD-10-CM | POA: Diagnosis not present

## 2019-09-09 DIAGNOSIS — M1990 Unspecified osteoarthritis, unspecified site: Secondary | ICD-10-CM | POA: Diagnosis not present

## 2019-09-09 DIAGNOSIS — M1460 Charcot's joint, unspecified site: Secondary | ICD-10-CM | POA: Diagnosis not present

## 2019-09-09 DIAGNOSIS — E039 Hypothyroidism, unspecified: Secondary | ICD-10-CM | POA: Diagnosis not present

## 2019-09-14 DIAGNOSIS — R2681 Unsteadiness on feet: Secondary | ICD-10-CM | POA: Diagnosis not present

## 2019-09-14 DIAGNOSIS — E039 Hypothyroidism, unspecified: Secondary | ICD-10-CM | POA: Diagnosis not present

## 2019-09-14 DIAGNOSIS — R1013 Epigastric pain: Secondary | ICD-10-CM | POA: Diagnosis not present

## 2019-09-14 DIAGNOSIS — M6281 Muscle weakness (generalized): Secondary | ICD-10-CM | POA: Diagnosis not present

## 2019-09-14 DIAGNOSIS — Z20828 Contact with and (suspected) exposure to other viral communicable diseases: Secondary | ICD-10-CM | POA: Diagnosis not present

## 2019-09-16 DIAGNOSIS — N3000 Acute cystitis without hematuria: Secondary | ICD-10-CM | POA: Diagnosis not present

## 2019-09-16 DIAGNOSIS — M1990 Unspecified osteoarthritis, unspecified site: Secondary | ICD-10-CM | POA: Diagnosis not present

## 2019-09-16 DIAGNOSIS — I10 Essential (primary) hypertension: Secondary | ICD-10-CM | POA: Diagnosis not present

## 2019-09-16 DIAGNOSIS — E039 Hypothyroidism, unspecified: Secondary | ICD-10-CM | POA: Diagnosis not present

## 2019-09-16 DIAGNOSIS — M1460 Charcot's joint, unspecified site: Secondary | ICD-10-CM | POA: Diagnosis not present

## 2019-09-23 DIAGNOSIS — M1460 Charcot's joint, unspecified site: Secondary | ICD-10-CM | POA: Diagnosis not present

## 2019-09-23 DIAGNOSIS — M1990 Unspecified osteoarthritis, unspecified site: Secondary | ICD-10-CM | POA: Diagnosis not present

## 2019-09-23 DIAGNOSIS — I10 Essential (primary) hypertension: Secondary | ICD-10-CM | POA: Diagnosis not present

## 2019-09-23 DIAGNOSIS — N3000 Acute cystitis without hematuria: Secondary | ICD-10-CM | POA: Diagnosis not present

## 2019-09-23 DIAGNOSIS — E039 Hypothyroidism, unspecified: Secondary | ICD-10-CM | POA: Diagnosis not present

## 2019-09-23 DIAGNOSIS — M792 Neuralgia and neuritis, unspecified: Secondary | ICD-10-CM | POA: Diagnosis not present

## 2019-10-30 DIAGNOSIS — M6281 Muscle weakness (generalized): Secondary | ICD-10-CM | POA: Diagnosis not present

## 2019-10-30 DIAGNOSIS — R2689 Other abnormalities of gait and mobility: Secondary | ICD-10-CM | POA: Diagnosis not present

## 2019-11-04 DIAGNOSIS — I10 Essential (primary) hypertension: Secondary | ICD-10-CM | POA: Diagnosis not present

## 2019-11-04 DIAGNOSIS — K219 Gastro-esophageal reflux disease without esophagitis: Secondary | ICD-10-CM | POA: Diagnosis not present

## 2019-11-04 DIAGNOSIS — R195 Other fecal abnormalities: Secondary | ICD-10-CM | POA: Diagnosis not present

## 2019-11-04 DIAGNOSIS — F419 Anxiety disorder, unspecified: Secondary | ICD-10-CM | POA: Diagnosis not present

## 2019-11-04 DIAGNOSIS — M1990 Unspecified osteoarthritis, unspecified site: Secondary | ICD-10-CM | POA: Diagnosis not present

## 2019-11-04 DIAGNOSIS — Z853 Personal history of malignant neoplasm of breast: Secondary | ICD-10-CM | POA: Diagnosis not present

## 2019-11-09 DIAGNOSIS — Z23 Encounter for immunization: Secondary | ICD-10-CM | POA: Diagnosis not present

## 2019-11-11 DIAGNOSIS — E039 Hypothyroidism, unspecified: Secondary | ICD-10-CM | POA: Diagnosis not present

## 2019-11-11 DIAGNOSIS — K219 Gastro-esophageal reflux disease without esophagitis: Secondary | ICD-10-CM | POA: Diagnosis not present

## 2019-11-11 DIAGNOSIS — I10 Essential (primary) hypertension: Secondary | ICD-10-CM | POA: Diagnosis not present

## 2019-11-11 DIAGNOSIS — F419 Anxiety disorder, unspecified: Secondary | ICD-10-CM | POA: Diagnosis not present

## 2019-11-11 DIAGNOSIS — M1990 Unspecified osteoarthritis, unspecified site: Secondary | ICD-10-CM | POA: Diagnosis not present

## 2019-12-02 DIAGNOSIS — M722 Plantar fascial fibromatosis: Secondary | ICD-10-CM | POA: Diagnosis not present

## 2019-12-02 DIAGNOSIS — K219 Gastro-esophageal reflux disease without esophagitis: Secondary | ICD-10-CM | POA: Diagnosis not present

## 2019-12-02 DIAGNOSIS — I10 Essential (primary) hypertension: Secondary | ICD-10-CM | POA: Diagnosis not present

## 2019-12-02 DIAGNOSIS — M1990 Unspecified osteoarthritis, unspecified site: Secondary | ICD-10-CM | POA: Diagnosis not present

## 2019-12-02 DIAGNOSIS — E039 Hypothyroidism, unspecified: Secondary | ICD-10-CM | POA: Diagnosis not present

## 2019-12-02 DIAGNOSIS — R195 Other fecal abnormalities: Secondary | ICD-10-CM | POA: Diagnosis not present

## 2019-12-07 DIAGNOSIS — Z23 Encounter for immunization: Secondary | ICD-10-CM | POA: Diagnosis not present

## 2019-12-10 DIAGNOSIS — M6281 Muscle weakness (generalized): Secondary | ICD-10-CM | POA: Diagnosis not present

## 2019-12-10 DIAGNOSIS — M25511 Pain in right shoulder: Secondary | ICD-10-CM | POA: Diagnosis not present

## 2019-12-10 DIAGNOSIS — M25571 Pain in right ankle and joints of right foot: Secondary | ICD-10-CM | POA: Diagnosis not present

## 2019-12-16 DIAGNOSIS — M25511 Pain in right shoulder: Secondary | ICD-10-CM | POA: Diagnosis not present

## 2019-12-16 DIAGNOSIS — M6281 Muscle weakness (generalized): Secondary | ICD-10-CM | POA: Diagnosis not present

## 2019-12-16 DIAGNOSIS — M25571 Pain in right ankle and joints of right foot: Secondary | ICD-10-CM | POA: Diagnosis not present

## 2019-12-19 DIAGNOSIS — M25511 Pain in right shoulder: Secondary | ICD-10-CM | POA: Diagnosis not present

## 2019-12-19 DIAGNOSIS — M25571 Pain in right ankle and joints of right foot: Secondary | ICD-10-CM | POA: Diagnosis not present

## 2019-12-19 DIAGNOSIS — M6281 Muscle weakness (generalized): Secondary | ICD-10-CM | POA: Diagnosis not present

## 2019-12-23 DIAGNOSIS — M6281 Muscle weakness (generalized): Secondary | ICD-10-CM | POA: Diagnosis not present

## 2019-12-23 DIAGNOSIS — M25511 Pain in right shoulder: Secondary | ICD-10-CM | POA: Diagnosis not present

## 2019-12-23 DIAGNOSIS — M25571 Pain in right ankle and joints of right foot: Secondary | ICD-10-CM | POA: Diagnosis not present

## 2019-12-25 DIAGNOSIS — M25511 Pain in right shoulder: Secondary | ICD-10-CM | POA: Diagnosis not present

## 2019-12-25 DIAGNOSIS — M25571 Pain in right ankle and joints of right foot: Secondary | ICD-10-CM | POA: Diagnosis not present

## 2019-12-25 DIAGNOSIS — M6281 Muscle weakness (generalized): Secondary | ICD-10-CM | POA: Diagnosis not present

## 2019-12-27 DIAGNOSIS — M25571 Pain in right ankle and joints of right foot: Secondary | ICD-10-CM | POA: Diagnosis not present

## 2019-12-27 DIAGNOSIS — M25511 Pain in right shoulder: Secondary | ICD-10-CM | POA: Diagnosis not present

## 2019-12-27 DIAGNOSIS — M6281 Muscle weakness (generalized): Secondary | ICD-10-CM | POA: Diagnosis not present

## 2019-12-30 DIAGNOSIS — M6281 Muscle weakness (generalized): Secondary | ICD-10-CM | POA: Diagnosis not present

## 2019-12-30 DIAGNOSIS — M25511 Pain in right shoulder: Secondary | ICD-10-CM | POA: Diagnosis not present

## 2019-12-30 DIAGNOSIS — M25571 Pain in right ankle and joints of right foot: Secondary | ICD-10-CM | POA: Diagnosis not present

## 2020-01-01 DIAGNOSIS — M25511 Pain in right shoulder: Secondary | ICD-10-CM | POA: Diagnosis not present

## 2020-01-01 DIAGNOSIS — M6281 Muscle weakness (generalized): Secondary | ICD-10-CM | POA: Diagnosis not present

## 2020-01-01 DIAGNOSIS — M25571 Pain in right ankle and joints of right foot: Secondary | ICD-10-CM | POA: Diagnosis not present

## 2020-01-03 DIAGNOSIS — M25511 Pain in right shoulder: Secondary | ICD-10-CM | POA: Diagnosis not present

## 2020-01-03 DIAGNOSIS — M25571 Pain in right ankle and joints of right foot: Secondary | ICD-10-CM | POA: Diagnosis not present

## 2020-01-03 DIAGNOSIS — M6281 Muscle weakness (generalized): Secondary | ICD-10-CM | POA: Diagnosis not present

## 2020-01-06 DIAGNOSIS — M6281 Muscle weakness (generalized): Secondary | ICD-10-CM | POA: Diagnosis not present

## 2020-01-06 DIAGNOSIS — M25511 Pain in right shoulder: Secondary | ICD-10-CM | POA: Diagnosis not present

## 2020-01-06 DIAGNOSIS — M25571 Pain in right ankle and joints of right foot: Secondary | ICD-10-CM | POA: Diagnosis not present

## 2020-01-08 DIAGNOSIS — M25511 Pain in right shoulder: Secondary | ICD-10-CM | POA: Diagnosis not present

## 2020-01-08 DIAGNOSIS — M6281 Muscle weakness (generalized): Secondary | ICD-10-CM | POA: Diagnosis not present

## 2020-01-08 DIAGNOSIS — M25571 Pain in right ankle and joints of right foot: Secondary | ICD-10-CM | POA: Diagnosis not present

## 2020-01-10 DIAGNOSIS — M25511 Pain in right shoulder: Secondary | ICD-10-CM | POA: Diagnosis not present

## 2020-01-10 DIAGNOSIS — M6281 Muscle weakness (generalized): Secondary | ICD-10-CM | POA: Diagnosis not present

## 2020-01-10 DIAGNOSIS — M25571 Pain in right ankle and joints of right foot: Secondary | ICD-10-CM | POA: Diagnosis not present

## 2020-01-13 DIAGNOSIS — M722 Plantar fascial fibromatosis: Secondary | ICD-10-CM | POA: Diagnosis not present

## 2020-01-13 DIAGNOSIS — K219 Gastro-esophageal reflux disease without esophagitis: Secondary | ICD-10-CM | POA: Diagnosis not present

## 2020-01-13 DIAGNOSIS — M25511 Pain in right shoulder: Secondary | ICD-10-CM | POA: Diagnosis not present

## 2020-01-13 DIAGNOSIS — G250 Essential tremor: Secondary | ICD-10-CM | POA: Diagnosis not present

## 2020-01-13 DIAGNOSIS — I10 Essential (primary) hypertension: Secondary | ICD-10-CM | POA: Diagnosis not present

## 2020-01-13 DIAGNOSIS — M6281 Muscle weakness (generalized): Secondary | ICD-10-CM | POA: Diagnosis not present

## 2020-01-13 DIAGNOSIS — G44039 Episodic paroxysmal hemicrania, not intractable: Secondary | ICD-10-CM | POA: Diagnosis not present

## 2020-01-13 DIAGNOSIS — M25571 Pain in right ankle and joints of right foot: Secondary | ICD-10-CM | POA: Diagnosis not present

## 2020-01-13 DIAGNOSIS — D0511 Intraductal carcinoma in situ of right breast: Secondary | ICD-10-CM | POA: Diagnosis not present

## 2020-01-13 DIAGNOSIS — M7661 Achilles tendinitis, right leg: Secondary | ICD-10-CM | POA: Diagnosis not present

## 2020-01-15 DIAGNOSIS — M25571 Pain in right ankle and joints of right foot: Secondary | ICD-10-CM | POA: Diagnosis not present

## 2020-01-15 DIAGNOSIS — M25511 Pain in right shoulder: Secondary | ICD-10-CM | POA: Diagnosis not present

## 2020-01-15 DIAGNOSIS — M6281 Muscle weakness (generalized): Secondary | ICD-10-CM | POA: Diagnosis not present

## 2020-01-17 DIAGNOSIS — M25571 Pain in right ankle and joints of right foot: Secondary | ICD-10-CM | POA: Diagnosis not present

## 2020-01-17 DIAGNOSIS — M6281 Muscle weakness (generalized): Secondary | ICD-10-CM | POA: Diagnosis not present

## 2020-01-17 DIAGNOSIS — M25511 Pain in right shoulder: Secondary | ICD-10-CM | POA: Diagnosis not present

## 2020-01-20 DIAGNOSIS — M6281 Muscle weakness (generalized): Secondary | ICD-10-CM | POA: Diagnosis not present

## 2020-01-20 DIAGNOSIS — M25571 Pain in right ankle and joints of right foot: Secondary | ICD-10-CM | POA: Diagnosis not present

## 2020-01-20 DIAGNOSIS — M25511 Pain in right shoulder: Secondary | ICD-10-CM | POA: Diagnosis not present

## 2020-01-22 DIAGNOSIS — M25511 Pain in right shoulder: Secondary | ICD-10-CM | POA: Diagnosis not present

## 2020-01-22 DIAGNOSIS — M6281 Muscle weakness (generalized): Secondary | ICD-10-CM | POA: Diagnosis not present

## 2020-01-22 DIAGNOSIS — M25571 Pain in right ankle and joints of right foot: Secondary | ICD-10-CM | POA: Diagnosis not present
